# Patient Record
Sex: Female | Born: 1950 | Race: White | Hispanic: No | Marital: Married | Smoking: Former smoker
Health system: Southern US, Community
[De-identification: ages and names within clinical notes are randomized; demographics above are authoritative.]

## PROBLEM LIST (undated history)

## (undated) DIAGNOSIS — M1990 Unspecified osteoarthritis, unspecified site: Secondary | ICD-10-CM

## (undated) DIAGNOSIS — I10 Essential (primary) hypertension: Secondary | ICD-10-CM

## (undated) DIAGNOSIS — R7303 Prediabetes: Secondary | ICD-10-CM

## (undated) DIAGNOSIS — M1712 Unilateral primary osteoarthritis, left knee: Secondary | ICD-10-CM

## (undated) DIAGNOSIS — T7840XA Allergy, unspecified, initial encounter: Secondary | ICD-10-CM

## (undated) HISTORY — DX: Unilateral primary osteoarthritis, left knee: M17.12

## (undated) HISTORY — PX: RHINOPLASTY: SUR1284

## (undated) HISTORY — PX: ABDOMINAL HYSTERECTOMY: SHX81

---

## 2011-01-11 ENCOUNTER — Ambulatory Visit: Payer: Self-pay

## 2013-12-10 DIAGNOSIS — S838X9A Sprain of other specified parts of unspecified knee, initial encounter: Secondary | ICD-10-CM | POA: Diagnosis not present

## 2013-12-10 DIAGNOSIS — S86819A Strain of other muscle(s) and tendon(s) at lower leg level, unspecified leg, initial encounter: Secondary | ICD-10-CM | POA: Diagnosis not present

## 2013-12-10 DIAGNOSIS — R300 Dysuria: Secondary | ICD-10-CM | POA: Diagnosis not present

## 2013-12-24 DIAGNOSIS — S335XXA Sprain of ligaments of lumbar spine, initial encounter: Secondary | ICD-10-CM | POA: Diagnosis not present

## 2013-12-24 DIAGNOSIS — R300 Dysuria: Secondary | ICD-10-CM | POA: Diagnosis not present

## 2014-01-19 DIAGNOSIS — R300 Dysuria: Secondary | ICD-10-CM | POA: Diagnosis not present

## 2014-01-19 DIAGNOSIS — N3000 Acute cystitis without hematuria: Secondary | ICD-10-CM | POA: Diagnosis not present

## 2014-02-09 DIAGNOSIS — R300 Dysuria: Secondary | ICD-10-CM | POA: Diagnosis not present

## 2014-02-09 DIAGNOSIS — H60399 Other infective otitis externa, unspecified ear: Secondary | ICD-10-CM | POA: Diagnosis not present

## 2014-05-04 DIAGNOSIS — R300 Dysuria: Secondary | ICD-10-CM | POA: Diagnosis not present

## 2014-05-04 DIAGNOSIS — N3000 Acute cystitis without hematuria: Secondary | ICD-10-CM | POA: Diagnosis not present

## 2014-06-11 DIAGNOSIS — N3000 Acute cystitis without hematuria: Secondary | ICD-10-CM | POA: Diagnosis not present

## 2014-06-11 DIAGNOSIS — R300 Dysuria: Secondary | ICD-10-CM | POA: Diagnosis not present

## 2014-06-22 DIAGNOSIS — N3000 Acute cystitis without hematuria: Secondary | ICD-10-CM | POA: Diagnosis not present

## 2014-06-22 DIAGNOSIS — M25569 Pain in unspecified knee: Secondary | ICD-10-CM | POA: Diagnosis not present

## 2014-09-27 DIAGNOSIS — N3000 Acute cystitis without hematuria: Secondary | ICD-10-CM | POA: Diagnosis not present

## 2014-12-06 DIAGNOSIS — J0100 Acute maxillary sinusitis, unspecified: Secondary | ICD-10-CM | POA: Diagnosis not present

## 2015-03-01 DIAGNOSIS — N3000 Acute cystitis without hematuria: Secondary | ICD-10-CM | POA: Diagnosis not present

## 2015-03-01 DIAGNOSIS — N3001 Acute cystitis with hematuria: Secondary | ICD-10-CM | POA: Diagnosis not present

## 2015-03-03 ENCOUNTER — Telehealth: Payer: Self-pay

## 2015-03-03 NOTE — Telephone Encounter (Signed)
-----   Message from [name], [place] sent at 03/03/2015 12:22 PM EDT ----- Regarding: urine culture Urine culture with E.Coli sensitive to the antibiotic (nitrofurantoin) you are on

## 2015-03-03 NOTE — Telephone Encounter (Signed)
Left message for patient to call back. KW 

## 2015-03-07 ENCOUNTER — Other Ambulatory Visit: Payer: Self-pay | Admitting: Family Medicine

## 2015-03-08 NOTE — Telephone Encounter (Signed)
Left message to call back  

## 2015-03-08 NOTE — Telephone Encounter (Signed)
Advised patient of lab results  

## 2015-03-08 NOTE — Telephone Encounter (Signed)
Pt returned call. Thanks TNP °

## 2015-03-08 NOTE — Telephone Encounter (Signed)
LMTCB

## 2015-03-17 ENCOUNTER — Other Ambulatory Visit: Payer: Self-pay | Admitting: Family Medicine

## 2015-03-17 DIAGNOSIS — J301 Allergic rhinitis due to pollen: Secondary | ICD-10-CM

## 2015-03-22 DIAGNOSIS — H269 Unspecified cataract: Secondary | ICD-10-CM | POA: Diagnosis not present

## 2015-04-05 ENCOUNTER — Other Ambulatory Visit: Payer: Self-pay

## 2015-04-05 ENCOUNTER — Other Ambulatory Visit: Payer: Self-pay | Admitting: Family Medicine

## 2015-04-14 ENCOUNTER — Other Ambulatory Visit: Payer: Self-pay | Admitting: Family Medicine

## 2015-04-15 ENCOUNTER — Other Ambulatory Visit: Payer: Self-pay | Admitting: Family Medicine

## 2015-04-19 DIAGNOSIS — H2513 Age-related nuclear cataract, bilateral: Secondary | ICD-10-CM | POA: Diagnosis not present

## 2015-04-28 DIAGNOSIS — H2513 Age-related nuclear cataract, bilateral: Secondary | ICD-10-CM | POA: Diagnosis not present

## 2015-05-06 ENCOUNTER — Encounter: Payer: Self-pay | Admitting: *Deleted

## 2015-05-09 NOTE — Discharge Instructions (Signed)

## 2015-05-11 ENCOUNTER — Encounter
Admission: RE | Disposition: A | Discharge status: Home / Self Care | Payer: Self-pay | Source: Ambulatory Visit | Attending: Ophthalmology

## 2015-05-11 ENCOUNTER — Ambulatory Visit: Payer: Medicare Other | Admitting: Anesthesiology

## 2015-05-11 ENCOUNTER — Ambulatory Visit
Admission: RE | Admit: 2015-05-11 | Discharge: 2015-05-11 | Disposition: A | Discharge status: Home / Self Care | Payer: Medicare Other | Source: Ambulatory Visit | Attending: Ophthalmology | Admitting: Ophthalmology

## 2015-05-11 DIAGNOSIS — Z882 Allergy status to sulfonamides status: Secondary | ICD-10-CM | POA: Insufficient documentation

## 2015-05-11 DIAGNOSIS — M1990 Unspecified osteoarthritis, unspecified site: Secondary | ICD-10-CM | POA: Insufficient documentation

## 2015-05-11 DIAGNOSIS — Z87891 Personal history of nicotine dependence: Secondary | ICD-10-CM | POA: Insufficient documentation

## 2015-05-11 DIAGNOSIS — H2511 Age-related nuclear cataract, right eye: Secondary | ICD-10-CM | POA: Diagnosis not present

## 2015-05-11 DIAGNOSIS — H2513 Age-related nuclear cataract, bilateral: Secondary | ICD-10-CM | POA: Diagnosis not present

## 2015-05-11 HISTORY — DX: Unspecified osteoarthritis, unspecified site: M19.90

## 2015-05-11 HISTORY — PX: CATARACT EXTRACTION W/PHACO: SHX586

## 2015-05-11 SURGERY — PHACOEMULSIFICATION, CATARACT, WITH IOL INSERTION
Anesthesia: Monitor Anesthesia Care | Laterality: Right | Wound class: Clean

## 2015-05-11 MED ORDER — LACTATED RINGERS IV SOLN: INTRAVENOUS | Status: DC

## 2015-05-11 MED ORDER — OXYCODONE HCL 5 MG PO TABS: 5.0000 mg | ORAL_TABLET | Freq: Once | ORAL | Status: DC | PRN

## 2015-05-11 MED ORDER — LIDOCAINE HCL (PF) 4 % IJ SOLN
INTRAOCULAR | Status: DC | PRN
  Administered 2015-05-11: 1 mL via OPHTHALMIC

## 2015-05-11 MED ORDER — ARMC OPHTHALMIC DILATING GEL
1.0000 "application " | OPHTHALMIC | Status: DC | PRN
  Administered 2015-05-11 (×2): 1 via OPHTHALMIC

## 2015-05-11 MED ORDER — NA HYALUR & NA CHOND-NA HYALUR 0.4-0.35 ML IO KIT
PACK | INTRAOCULAR | Status: DC | PRN
  Administered 2015-05-11: 1 mL via INTRAOCULAR

## 2015-05-11 MED ORDER — CEFUROXIME OPHTHALMIC INJECTION 1 MG/0.1 ML
INJECTION | OPHTHALMIC | Status: DC | PRN
  Administered 2015-05-11: 0.1 mL via OPHTHALMIC

## 2015-05-11 MED ORDER — EPINEPHRINE HCL 1 MG/ML IJ SOLN
INTRAOCULAR | Status: DC | PRN
  Administered 2015-05-11: 67 mL via OPHTHALMIC

## 2015-05-11 MED ORDER — BRIMONIDINE TARTRATE 0.2 % OP SOLN
OPHTHALMIC | Status: DC | PRN
  Administered 2015-05-11: 1 [drp] via OPHTHALMIC

## 2015-05-11 MED ORDER — TIMOLOL MALEATE 0.5 % OP SOLN
OPHTHALMIC | Status: DC | PRN
  Administered 2015-05-11: 1 [drp] via OPHTHALMIC

## 2015-05-11 MED ORDER — TETRACAINE HCL 0.5 % OP SOLN
1.0000 [drp] | OPHTHALMIC | Status: DC | PRN
  Administered 2015-05-11: 1 [drp] via OPHTHALMIC

## 2015-05-11 MED ORDER — MIDAZOLAM HCL 2 MG/2ML IJ SOLN
INTRAMUSCULAR | Status: DC | PRN
  Administered 2015-05-11: 2 mg via INTRAVENOUS

## 2015-05-11 MED ORDER — FENTANYL CITRATE (PF) 100 MCG/2ML IJ SOLN
INTRAMUSCULAR | Status: DC | PRN
  Administered 2015-05-11: 100 ug via INTRAVENOUS

## 2015-05-11 MED ORDER — OXYCODONE HCL 5 MG/5ML PO SOLN: 5.0000 mg | Freq: Once | ORAL | Status: DC | PRN

## 2015-05-11 MED ORDER — POVIDONE-IODINE 5 % OP SOLN
1.0000 "application " | OPHTHALMIC | Status: DC | PRN
  Administered 2015-05-11: 1 via OPHTHALMIC

## 2015-05-11 SURGICAL SUPPLY — 26 items
CANNULA ANT/CHMB 27GA (MISCELLANEOUS) ×3 IMPLANT
GLOVE SURG LX 7.5 STRW (GLOVE) ×2
GLOVE SURG LX STRL 7.5 STRW (GLOVE) ×1 IMPLANT
GLOVE SURG TRIUMPH 8.0 PF LTX (GLOVE) ×3 IMPLANT
GOWN STRL REUS W/ TWL LRG LVL3 (GOWN DISPOSABLE) ×2 IMPLANT
GOWN STRL REUS W/TWL LRG LVL3 (GOWN DISPOSABLE) ×4
LENS IOL TECNIS 18.5 (Intraocular Lens) ×3 IMPLANT
LENS IOL TECNIS MONO 1P 18.5 (Intraocular Lens) ×1 IMPLANT
MARKER SKIN SURG W/RULER VIO (MISCELLANEOUS) ×3 IMPLANT
NDL RETROBULBAR .5 NSTRL (NEEDLE) IMPLANT
NEEDLE FILTER BLUNT 18X 1/2SAF (NEEDLE) ×2
NEEDLE FILTER BLUNT 18X1 1/2 (NEEDLE) ×1 IMPLANT
PACK CATARACT BRASINGTON (MISCELLANEOUS) ×3 IMPLANT
PACK EYE AFTER SURG (MISCELLANEOUS) ×3 IMPLANT
PACK OPTHALMIC (MISCELLANEOUS) ×3 IMPLANT
RING MALYGIN 7.0 (MISCELLANEOUS) IMPLANT
SUT ETHILON 10-0 CS-B-6CS-B-6 (SUTURE)
SUT VICRYL  9 0 (SUTURE)
SUT VICRYL 9 0 (SUTURE) IMPLANT
SUTURE EHLN 10-0 CS-B-6CS-B-6 (SUTURE) IMPLANT
SYR 3ML LL SCALE MARK (SYRINGE) ×3 IMPLANT
SYR 5ML LL (SYRINGE) IMPLANT
SYR TB 1ML LUER SLIP (SYRINGE) ×3 IMPLANT
WATER STERILE IRR 250ML POUR (IV SOLUTION) ×3 IMPLANT
WATER STERILE IRR 500ML POUR (IV SOLUTION) IMPLANT
WIPE NON LINTING 3.25X3.25 (MISCELLANEOUS) ×3 IMPLANT

## 2015-05-11 NOTE — H&P (Signed)
  The History and Physical notes were scanned in.  The patient remains stable and unchanged from the H&P.   Previous H&P reviewed, patient examined, and there are no changes.  Tammy Hart 05/11/2015 9:45 AM

## 2015-05-11 NOTE — Op Note (Signed)
LOCATION:  [facility]   PREOPERATIVE DIAGNOSIS:    Nuclear sclerotic cataract right eye. H25.11   POSTOPERATIVE DIAGNOSIS:  Nuclear sclerotic cataract right eye.     PROCEDURE:  Phacoemusification with posterior chamber intraocular lens placement of the right eye   LENS:   Implant Name Type Inv. Item Serial No. Manufacturer Lot No. LRB No. Used  [id number] lens     [id number] [employer]   Right 1     18.5 D [id number]   ULTRASOUND TIME: 18.5 % of 1 minutes, 14 seconds.  CDE 13.7   SURGEON:  Deirdre Evener, MD   ANESTHESIA:  Topical with tetracaine drops and 2% Xylocaine jelly.   COMPLICATIONS:  None.   DESCRIPTION OF PROCEDURE:  The patient was identified in the holding room and transported to the operating room and placed in the supine position under the operating microscope.  The right eye was identified as the operative eye and it was prepped and draped in the usual sterile ophthalmic fashion.   A 1 millimeter clear-corneal paracentesis was made at the 12:00 position.  The anterior chamber was filled with Viscoat viscoelastic.  A 2.4 millimeter keratome was used to make a near-clear corneal incision at the 9:00 position.  A curvilinear capsulorrhexis was made with a cystotome and capsulorrhexis forceps.  Balanced salt solution was used to hydrodissect and hydrodelineate the nucleus.   Phacoemulsification was then used in stop and chop fashion to remove the lens nucleus and epinucleus.  The remaining cortex was then removed using the irrigation and aspiration handpiece. Provisc was then placed into the capsular bag to distend it for lens placement.  A lens was then injected into the capsular bag.  The remaining viscoelastic was aspirated.   Wounds were hydrated with balanced salt solution.  The anterior chamber was inflated to a physiologic pressure with balanced salt solution.  No wound leaks were noted. Cefuroxime 0.1 ml of a /ml solution was injected  into the anterior chamber for a dose of 1 mg of intracameral antibiotic at the completion of the case.   Timolol and Brimonidine drops were applied to the eye.  The patient was taken to the recovery room in stable condition without complications of anesthesia or surgery.   Charlyn Vialpando 05/11/2015, 10:38 AM

## 2015-05-11 NOTE — Transfer of Care (Addendum)
Immediate Anesthesia Transfer of Care Note  Patient: Tammy Hart  Procedure(s) Performed: Procedure(s) with comments: CATARACT EXTRACTION PHACO AND INTRAOCULAR LENS PLACEMENT (IOC) (Right) - PER DR OFFICE(ELLEN) PLEASE KEEP PT ARRIVAL TIME 9-9:30  Patient Location: PACU  Anesthesia Type: MAC  Level of Consciousness: awake, alert  and patient cooperative  Airway and Oxygen Therapy: Patient Spontanous Breathing and Patient connected to supplemental oxygen  Post-op Assessment: Post-op Vital signs reviewed, Patient's Cardiovascular Status Stable, Respiratory Function Stable, Patent Airway and No signs of Nausea or vomiting  Post-op Vital Signs: Reviewed and stable  Complications: No apparent anesthesia complications

## 2015-05-11 NOTE — Anesthesia Preprocedure Evaluation (Signed)
Anesthesia Evaluation  Patient identified by MRN, date of birth, ID band Patient awake    Reviewed: Allergy & Precautions, H&P , NPO status   History of Anesthesia Complications Negative for: history of anesthetic complications  Airway Mallampati: II  TM Distance: >3 FB Neck ROM: full    Dental no notable dental hx.    Pulmonary former smoker,    Pulmonary exam normal       Cardiovascular negative cardio ROS Normal cardiovascular exam    Neuro/Psych anxiety   GI/Hepatic negative GI ROS, Neg liver ROS,   Endo/Other  negative endocrine ROS  Renal/GU negative Renal ROS  negative genitourinary   Musculoskeletal   Abdominal   Peds  Hematology negative hematology ROS (+)   Anesthesia Other Findings   Reproductive/Obstetrics                             Anesthesia Physical Anesthesia Plan  ASA: II  Anesthesia Plan: MAC   Post-op Pain Management:    Induction:   Airway Management Planned:   Additional Equipment:   Intra-op Plan:   Post-operative Plan:   Informed Consent: I have reviewed the patients History and Physical, chart, labs and discussed the procedure including the risks, benefits and alternatives for the proposed anesthesia with the patient or authorized representative who has indicated his/her understanding and acceptance.     Plan Discussed with: CRNA  Anesthesia Plan Comments:         Anesthesia Quick Evaluation

## 2015-05-11 NOTE — Anesthesia Procedure Notes (Signed)
Procedure Name: MAC Performed by: Marjan Rosman Pre-anesthesia Checklist: Patient identified, Emergency Drugs available, Suction available, Timeout performed and Patient being monitored Patient Re-evaluated:Patient Re-evaluated prior to inductionOxygen Delivery Method: Nasal cannula Placement Confirmation: positive ETCO2     

## 2015-05-11 NOTE — Anesthesia Postprocedure Evaluation (Signed)
  Anesthesia Post-op Note  Patient: Tammy Hart  Procedure(s) Performed: Procedure(s) with comments: CATARACT EXTRACTION PHACO AND INTRAOCULAR LENS PLACEMENT (IOC) (Right) - PER DR OFFICE(ELLEN) PLEASE KEEP PT ARRIVAL TIME 9-9:30  Anesthesia type:MAC  Patient location: PACU  Post pain: Pain level controlled  Post assessment: Post-op Vital signs reviewed, Patient's Cardiovascular Status Stable, Respiratory Function Stable, Patent Airway and No signs of Nausea or vomiting  Post vital signs: Reviewed and stable  Last Vitals:  Filed Vitals:   05/11/15 1045  BP:   Pulse: 77  Temp: 36.7 C  Resp: 17    Level of consciousness: awake, alert  and patient cooperative  Complications: No apparent anesthesia complications

## 2015-05-12 ENCOUNTER — Encounter: Payer: Self-pay | Admitting: Ophthalmology

## 2015-05-31 ENCOUNTER — Encounter: Payer: Self-pay | Admitting: Ophthalmology

## 2015-05-31 NOTE — Addendum Note (Signed)
Addendum  created 05/31/15 0736 by Andee Poles, CRNA   Modules edited: Anesthesia Events, Narrator, Narrator Events, Notes Section   Narrator:  Narrator: Event Log Edited   Narrator Events:  Delete Handoff event   Notes Section:  File: [id number]

## 2015-06-27 ENCOUNTER — Encounter: Payer: Self-pay | Admitting: Family Medicine

## 2015-06-27 ENCOUNTER — Ambulatory Visit (INDEPENDENT_AMBULATORY_CARE_PROVIDER_SITE_OTHER): Payer: Medicare Other | Admitting: Family Medicine

## 2015-06-27 ENCOUNTER — Other Ambulatory Visit: Payer: Self-pay | Admitting: Family Medicine

## 2015-06-27 VITALS — BP 132/74 | HR 92 | Temp 98.5°F | Resp 16 | Wt 230.6 lb

## 2015-06-27 DIAGNOSIS — N3090 Cystitis, unspecified without hematuria: Secondary | ICD-10-CM

## 2015-06-27 LAB — POCT URINALYSIS DIPSTICK
GLUCOSE: NEGATIVE
NITRITE UA: POSITIVE
Spec Grav, UA: <=1.005
UROBILINOGEN UA: NEGATIVE
pH, UA: 6

## 2015-06-27 MED ORDER — AMOXICILLIN-POT CLAVULANATE 875-125 MG PO TABS
1.0000 | ORAL_TABLET | Freq: Two times a day (BID) | ORAL | Status: DC
Start: 2015-06-27 — End: 2015-06-27

## 2015-06-27 MED ORDER — AMOXICILLIN-POT CLAVULANATE 875-125 MG PO TABS: 1.0000 | ORAL_TABLET | Freq: Two times a day (BID) | ORAL | Status: DC

## 2015-06-27 NOTE — Patient Instructions (Signed)
We will call you with the urine culture result. 

## 2015-06-27 NOTE — Progress Notes (Signed)
Subjective:     Patient ID: Tammy Hart, female   DOB: 12-31-1950, 64 y.o.   MRN: [id number]  HPI  Chief Complaint  Patient presents with  . Dysuria    Patient comes in office today with concerns of burning with urination for the past 24hrs. Patient states she has had lower abdominal opain, and frequency of urination she has taken otc Azo  Last treated in May for an E. Coli UTI with Macrobid.   Review of Systems  Constitutional: Negative for fever and chills.  Gastrointestinal:       No hx of diverticulitis or screening colonscopy       Objective:   Physical Exam  Constitutional: She appears well-developed and well-nourished. No distress.  Abdominal: There is tenderness (mild diffuse in LLQ). There is no guarding.  Genitourinary:  No cva tenderness       Assessment:    1. Cystitis - POCT urinalysis dipstick - Urine culture - amoxicillin-clavulanate (AUGMENTIN) 875-125 MG per tablet; Take 1 tablet by mouth 2 (two) times daily.  Dispense: 14 tablet; Refill 0    Plan:    Further f/u pending urine culture results.

## 2015-06-29 LAB — URINE CULTURE

## 2015-06-30 ENCOUNTER — Telehealth: Payer: Self-pay

## 2015-06-30 NOTE — Telephone Encounter (Signed)
LMTCB-KW 

## 2015-06-30 NOTE — Telephone Encounter (Signed)
-----   Message from Anola Gurney, [place] sent at 06/30/2015  7:42 AM EDT ----- Continue Augmentin for routine E.coli infection.

## 2015-06-30 NOTE — Telephone Encounter (Signed)
Patient has been advised. KW 

## 2015-07-28 ENCOUNTER — Other Ambulatory Visit: Payer: Self-pay | Admitting: Family Medicine

## 2015-08-08 ENCOUNTER — Ambulatory Visit (INDEPENDENT_AMBULATORY_CARE_PROVIDER_SITE_OTHER): Payer: Medicare Other | Admitting: Family Medicine

## 2015-08-08 ENCOUNTER — Encounter: Payer: Self-pay | Admitting: Family Medicine

## 2015-08-08 ENCOUNTER — Other Ambulatory Visit: Payer: Self-pay | Admitting: Family Medicine

## 2015-08-08 VITALS — BP 124/62 | HR 89 | Temp 98.1°F | Resp 16 | Ht 65.5 in | Wt 229.0 lb

## 2015-08-08 DIAGNOSIS — N3001 Acute cystitis with hematuria: Secondary | ICD-10-CM

## 2015-08-08 DIAGNOSIS — Z833 Family history of diabetes mellitus: Secondary | ICD-10-CM | POA: Diagnosis not present

## 2015-08-08 DIAGNOSIS — R739 Hyperglycemia, unspecified: Secondary | ICD-10-CM | POA: Diagnosis not present

## 2015-08-08 DIAGNOSIS — Z87898 Personal history of other specified conditions: Secondary | ICD-10-CM | POA: Insufficient documentation

## 2015-08-08 DIAGNOSIS — J309 Allergic rhinitis, unspecified: Secondary | ICD-10-CM | POA: Insufficient documentation

## 2015-08-08 DIAGNOSIS — J301 Allergic rhinitis due to pollen: Secondary | ICD-10-CM | POA: Diagnosis not present

## 2015-08-08 LAB — POCT URINALYSIS DIPSTICK
GLUCOSE UA: NEGATIVE
Ketones, UA: POSITIVE
NITRITE UA: POSITIVE
PROTEIN UA: 30
Spec Grav, UA: 1.015
UROBILINOGEN UA: 2
pH, UA: 6

## 2015-08-08 LAB — POCT GLYCOSYLATED HEMOGLOBIN (HGB A1C)

## 2015-08-08 MED ORDER — NITROFURANTOIN MONOHYD MACRO 100 MG PO CAPS: 100.0000 mg | ORAL_CAPSULE | Freq: Two times a day (BID) | ORAL | Status: DC

## 2015-08-08 NOTE — Patient Instructions (Signed)
We will call you with the urine culture report. [facility].

## 2015-08-08 NOTE — Progress Notes (Signed)
Subjective:     Patient ID: Tammy Hart, female   DOB: 07/11/1951, 64 y.o.   MRN: [id number]  HPI  Chief Complaint  Patient presents with  . Urinary Tract Infection    Patient comes in office today with complaints of urinary frequency,dysuria,hematuria and groin pain for the past 24 hrs. Patient reports that she has taken otc Azo for relief  Last treated for an E. Coli infection in September. Has had 3  UTI's this year in the absence of sexual activity and no hx of kidney stones. I told her I was concerned about her sugar being elevated. Reports an uncle with diabetes.   Review of Systems  Constitutional: Negative for fever and chills.  Endocrine: Negative for polydipsia, polyphagia and polyuria.  Musculoskeletal:       Reports left knee pain and difficulty flexing has limited her ability to exercise.       Objective:   Physical Exam  Constitutional: She appears well-developed and well-nourished. No distress.       Assessment:    1. Acute cystitis with hematuria - Urine culture - POCT urinalysis dipstick - nitrofurantoin, macrocrystal-monohydrate, (MACROBID) 100 MG capsule; Take 1 capsule (100 mg total) by mouth 2 (two) times daily.  Dispense: 14 capsule; Refill: 0  2. Family history of diabetes mellitus type II  3. Hyperglycemia - POCT glycosylated hemoglobin (Hb A1C)    Plan:    She wishes to amend her lifestyle-provided with information about pre-diabetes classes. Will recheck her sugar in 3 months.

## 2015-08-11 LAB — URINE CULTURE

## 2015-08-12 ENCOUNTER — Telehealth: Payer: Self-pay

## 2015-08-12 NOTE — Telephone Encounter (Signed)
Patient has been notified. KW 

## 2015-08-12 NOTE — Telephone Encounter (Signed)
-----   Message from [name], [place] sent at 08/12/2015 12:05 PM EST ----- Continue nitrofurantoin for an e.Coli UTI. Getting your sugar under control may lessen frequency of infections.

## 2015-08-25 ENCOUNTER — Other Ambulatory Visit: Payer: Self-pay | Admitting: Family Medicine

## 2015-09-26 ENCOUNTER — Other Ambulatory Visit: Payer: Self-pay | Admitting: Family Medicine

## 2015-10-17 ENCOUNTER — Other Ambulatory Visit: Payer: Self-pay | Admitting: Family Medicine

## 2015-10-17 ENCOUNTER — Telehealth: Payer: Self-pay

## 2015-10-17 DIAGNOSIS — G4700 Insomnia, unspecified: Secondary | ICD-10-CM

## 2015-10-17 NOTE — Telephone Encounter (Signed)
-----   Message from [name], [place] sent at 10/17/2015  7:59 AM EST ----- PLEASE CALL IN DIAZEPAM AS UPDATED IN THE EMR

## 2015-10-17 NOTE — Telephone Encounter (Signed)
Prescription was called into pharmacy. KW 

## 2015-10-26 ENCOUNTER — Other Ambulatory Visit: Payer: Self-pay | Admitting: Family Medicine

## 2015-11-09 ENCOUNTER — Ambulatory Visit
Admission: RE | Admit: 2015-11-09 | Discharge: 2015-11-09 | Disposition: A | Discharge status: Home / Self Care | Payer: Medicare Other | Source: Ambulatory Visit | Attending: Family Medicine | Admitting: Family Medicine

## 2015-11-09 ENCOUNTER — Encounter: Payer: Self-pay | Admitting: Family Medicine

## 2015-11-09 ENCOUNTER — Ambulatory Visit (INDEPENDENT_AMBULATORY_CARE_PROVIDER_SITE_OTHER): Payer: Medicare Other | Admitting: Family Medicine

## 2015-11-09 VITALS — BP 124/66 | HR 80 | Temp 98.2°F | Resp 16 | Wt 209.8 lb

## 2015-11-09 DIAGNOSIS — M25562 Pain in left knee: Principal | ICD-10-CM

## 2015-11-09 DIAGNOSIS — R739 Hyperglycemia, unspecified: Secondary | ICD-10-CM | POA: Diagnosis not present

## 2015-11-09 DIAGNOSIS — G8929 Other chronic pain: Secondary | ICD-10-CM

## 2015-11-09 DIAGNOSIS — M1712 Unilateral primary osteoarthritis, left knee: Secondary | ICD-10-CM | POA: Diagnosis not present

## 2015-11-09 DIAGNOSIS — M179 Osteoarthritis of knee, unspecified: Secondary | ICD-10-CM | POA: Diagnosis not present

## 2015-11-09 DIAGNOSIS — M25569 Pain in unspecified knee: Secondary | ICD-10-CM

## 2015-11-09 LAB — POCT GLYCOSYLATED HEMOGLOBIN (HGB A1C): Hemoglobin A1C: 6.5

## 2015-11-09 NOTE — Patient Instructions (Addendum)
We will call with x-ray results. Continue with Lifestyle changes.

## 2015-11-09 NOTE — Progress Notes (Signed)
Subjective:     Patient ID: Tammy Hart, female   DOB: [birth year], 65 y.o.   MRN: [id number]  HPI  Chief Complaint  Patient presents with  . Hyperglycemia    Patient comes in office today for 3 month follow up, last visit was 08/08/15 HgbA1C in office was 7.1%. Patient was advised to work on lifestyle changes and enroll in pre-diabetes class  States she has made dramatic changes in her food choice after attending the Lifestyle class: little bread or sweets and no soda. Her ability to exercise is limited due to chronic left knee pain. She can not flex> 90 degrees without significant pain and has trouble with stairs. Denies significant instablity or swelling.   Review of Systems     Objective:   Physical Exam  Constitutional: She appears well-developed and well-nourished. No distress.  Musculoskeletal:  Left knee without specific areas of tenderness; no effusion or erythema. Ligaments stable. Increased pain with flexion > 90 degrees;       Assessment:    1. Hyperglycemia: Improved!                                        POCT glycosylated hemoglobin (Hb A1C)  2. Chronic knee pain, left - DG Knee Complete 4 Views Left; Future    Plan:    Continue with lifestyle changes/weight loss. Further f/u pending x-ray results.

## 2015-11-10 ENCOUNTER — Telehealth: Payer: Self-pay | Admitting: Family Medicine

## 2015-11-10 ENCOUNTER — Telehealth: Payer: Self-pay

## 2015-11-10 NOTE — Telephone Encounter (Signed)
Pt is requesting referral to Dr Charlann Boxer for left knee pain.Contact at his office is [place].Phone [phone]

## 2015-11-10 NOTE — Telephone Encounter (Signed)
Patient advised as directed below.  Patient does want to proceed with Ortho referral. Patient prefers a appointment at [facility] with Dr. Charlann Boxer. Patient states she will call back to clarify the providers name.

## 2015-11-10 NOTE — Telephone Encounter (Signed)
-----   Message from Anola Gurney, [place] sent at 11/10/2015  7:48 AM EST ----- Significant arthritic changes. Do you wish to see an orthopedic doctor to discuss your options?

## 2015-11-11 ENCOUNTER — Ambulatory Visit: Payer: Self-pay | Admitting: Family Medicine

## 2015-11-11 ENCOUNTER — Other Ambulatory Visit: Payer: Self-pay | Admitting: Family Medicine

## 2015-11-11 DIAGNOSIS — M25562 Pain in left knee: Principal | ICD-10-CM

## 2015-11-11 DIAGNOSIS — G8929 Other chronic pain: Secondary | ICD-10-CM

## 2015-11-29 ENCOUNTER — Ambulatory Visit: Payer: Self-pay | Admitting: Family Medicine

## 2015-11-30 ENCOUNTER — Ambulatory Visit (INDEPENDENT_AMBULATORY_CARE_PROVIDER_SITE_OTHER): Payer: Medicare Other | Admitting: Family Medicine

## 2015-11-30 ENCOUNTER — Encounter: Payer: Self-pay | Admitting: Family Medicine

## 2015-11-30 VITALS — BP 122/74 | HR 74 | Temp 98.0°F | Resp 18 | Wt 206.0 lb

## 2015-11-30 DIAGNOSIS — J301 Allergic rhinitis due to pollen: Secondary | ICD-10-CM

## 2015-11-30 MED ORDER — PREDNISONE 10 MG PO TABS: ORAL_TABLET | ORAL | Status: DC

## 2015-11-30 NOTE — Progress Notes (Signed)
Subjective:     Patient ID: Tammy Hart, female   DOB: Feb 02, 1951, 65 y.o.   MRN: [id number]  HPI  Chief Complaint  Patient presents with  . Cough    Patient comes in office today with concerns of cough and congestion for the past week. Patient reports sinus pain and pressue (facial), trouble breathing and congestion, she states that she has tried otc Flonase, Claritin D and Afrin.   States she feels her allergies have flared with sinus pressure, clear PND and occasional cough. Has been using steroid nasal spray for a week. She has used Afrin nightly on a chronic basis but not when she is using the Flonase.   Review of Systems  Constitutional: Negative for fever and chills.       Objective:   Physical Exam  Constitutional: She appears well-developed and well-nourished. No distress.  Ears: T.M's intact without inflammation Sinuses: mild maxillary sinus tenderness Throat: no tonsillar enlargement or exudate Neck: no cervical adenopathy Lungs: clear     Assessment:    1. Allergic rhinitis due to pollen - predniSONE (DELTASONE) 10 MG tablet; Taper daily as follows: 6 pills, 5, 4, 3, 2, 1  Dispense: 21 tablet; Refill: 0    Plan:    Continue Claritin D and Flonase. Call if not improving. Stop Afrin.

## 2015-11-30 NOTE — Patient Instructions (Signed)
Continue Claritin D and Flonase. Stop Afrin. Let me know how you do on the prednisone.

## 2015-12-07 ENCOUNTER — Encounter: Payer: Self-pay | Admitting: *Deleted

## 2015-12-07 DIAGNOSIS — Z961 Presence of intraocular lens: Secondary | ICD-10-CM | POA: Diagnosis not present

## 2015-12-28 DIAGNOSIS — M25562 Pain in left knee: Secondary | ICD-10-CM | POA: Diagnosis not present

## 2016-01-09 ENCOUNTER — Encounter: Payer: Self-pay | Admitting: Family Medicine

## 2016-01-09 ENCOUNTER — Other Ambulatory Visit: Payer: Self-pay | Admitting: Family Medicine

## 2016-01-09 ENCOUNTER — Ambulatory Visit (INDEPENDENT_AMBULATORY_CARE_PROVIDER_SITE_OTHER): Payer: Medicare Other | Admitting: Family Medicine

## 2016-01-09 VITALS — BP 116/72 | HR 66 | Temp 98.5°F | Resp 16 | Wt 200.0 lb

## 2016-01-09 DIAGNOSIS — N3090 Cystitis, unspecified without hematuria: Secondary | ICD-10-CM | POA: Diagnosis not present

## 2016-01-09 LAB — POCT URINALYSIS DIPSTICK
Glucose, UA: NEGATIVE
KETONES UA: NEGATIVE
NITRITE UA: POSITIVE
PH UA: 6.5
PROTEIN UA: NEGATIVE
Spec Grav, UA: 1.01
UROBILINOGEN UA: 1

## 2016-01-09 MED ORDER — NITROFURANTOIN MONOHYD MACRO 100 MG PO CAPS
100.0000 mg | ORAL_CAPSULE | Freq: Two times a day (BID) | ORAL | Status: DC
Start: 2016-01-09 — End: 2016-01-18

## 2016-01-09 NOTE — Progress Notes (Signed)
Subjective:     Patient ID: Tammy Hart, female   DOB: 07/01/1951, 65 y.o.   MRN: [id number]  HPI  Chief Complaint  Patient presents with  . Urinary Tract Infection    Patient comes in office today with concerns of a urinary infection. Patient complains of dysuria and frequency for the past 4 days.   States she received a steroid injection in her knee prior to the onset of her sx. Last urine culture in the Fall with a sensitive E.Coli. She has continued to lose weight with dramatic changes in her lifestyle. Down 6# since 08/07/16.   Review of Systems  Constitutional: Negative for fever and chills.  Musculoskeletal:       Seeing [facility]([place]) for significant left knee arthritis. Pending physical therapy.       Objective:   Physical Exam  Constitutional: She appears well-developed and well-nourished. No distress.  Genitourinary:  No c.v.a tenderness       Assessment:    1. Cystitis - POCT urinalysis dipstick - Urine culture - nitrofurantoin, macrocrystal-monohydrate, (MACROBID) 100 MG capsule; Take 1 capsule (100 mg total) by mouth 2 (two) times daily.  Dispense: 14 capsule; Refill: 0   Plan:    Further f/u pending urine culture.

## 2016-01-09 NOTE — Patient Instructions (Signed)
We will call you with the culture results 

## 2016-01-11 ENCOUNTER — Telehealth: Payer: Self-pay

## 2016-01-11 DIAGNOSIS — R262 Difficulty in walking, not elsewhere classified: Secondary | ICD-10-CM | POA: Diagnosis not present

## 2016-01-11 DIAGNOSIS — M25562 Pain in left knee: Secondary | ICD-10-CM | POA: Diagnosis not present

## 2016-01-11 DIAGNOSIS — M25662 Stiffness of left knee, not elsewhere classified: Secondary | ICD-10-CM | POA: Diagnosis not present

## 2016-01-11 LAB — URINE CULTURE

## 2016-01-11 NOTE — Telephone Encounter (Signed)
-----   Message from [name], [place] sent at 01/11/2016 11:50 AM EDT ----- Continue nitrofurantion for a routine E. Coli infection.

## 2016-01-11 NOTE — Telephone Encounter (Signed)
Patient advised as directed below. 

## 2016-01-17 DIAGNOSIS — M25662 Stiffness of left knee, not elsewhere classified: Secondary | ICD-10-CM | POA: Diagnosis not present

## 2016-01-17 DIAGNOSIS — M25562 Pain in left knee: Secondary | ICD-10-CM | POA: Diagnosis not present

## 2016-01-17 DIAGNOSIS — R262 Difficulty in walking, not elsewhere classified: Secondary | ICD-10-CM | POA: Diagnosis not present

## 2016-01-18 ENCOUNTER — Encounter: Payer: Self-pay | Admitting: Family Medicine

## 2016-01-18 ENCOUNTER — Ambulatory Visit (INDEPENDENT_AMBULATORY_CARE_PROVIDER_SITE_OTHER): Payer: Medicare Other | Admitting: Family Medicine

## 2016-01-18 ENCOUNTER — Other Ambulatory Visit: Payer: Self-pay | Admitting: Family Medicine

## 2016-01-18 VITALS — BP 102/74 | HR 80 | Temp 98.4°F | Resp 16 | Wt 199.0 lb

## 2016-01-18 DIAGNOSIS — N3091 Cystitis, unspecified with hematuria: Secondary | ICD-10-CM

## 2016-01-18 LAB — POCT URINALYSIS DIPSTICK: GLUCOSE UA: 100

## 2016-01-18 MED ORDER — CIPROFLOXACIN HCL 250 MG PO TABS: 250.0000 mg | ORAL_TABLET | Freq: Two times a day (BID) | ORAL | Status: DC

## 2016-01-18 NOTE — Patient Instructions (Signed)
We will call you with the urine culture results. 

## 2016-01-18 NOTE — Progress Notes (Signed)
Subjective:     Patient ID: Tammy Hart, female   DOB: 03/25/1951, 65 y.o.   MRN: [id number]  HPI  Chief Complaint  Patient presents with  . Dysuria    Patient comes in office today with concerns of burning with urination and blood in urine. Patient was seen in office on 4/14 and treated for UTI, patient states that she completed antibiotic but did not notice any improvement.   Urine culture with a sensitive E.Coli.   Review of Systems  Constitutional: Negative for fever and chills.  Genitourinary: Negative for vaginal discharge.       Objective:   Physical Exam  Constitutional: She appears well-developed and well-nourished. No distress.       Assessment:    1. Cystitis with hematuria - POCT urinalysis dipstick - Urine culture - ciprofloxacin (CIPRO) 250 MG tablet; Take 1 tablet (250 mg total) by mouth 2 (two) times daily.  Dispense: 14 tablet; Refill: 0    Plan:    Further f/u pending culture results.

## 2016-01-19 DIAGNOSIS — M25562 Pain in left knee: Secondary | ICD-10-CM | POA: Diagnosis not present

## 2016-01-19 DIAGNOSIS — M25662 Stiffness of left knee, not elsewhere classified: Secondary | ICD-10-CM | POA: Diagnosis not present

## 2016-01-19 DIAGNOSIS — R262 Difficulty in walking, not elsewhere classified: Secondary | ICD-10-CM | POA: Diagnosis not present

## 2016-01-20 ENCOUNTER — Telehealth: Payer: Self-pay

## 2016-01-20 LAB — URINE CULTURE

## 2016-01-20 NOTE — Telephone Encounter (Signed)
-----   Message from [name], [place] sent at 01/20/2016 12:00 PM EDT ----- Same E. Coli sensitive to everything.

## 2016-01-20 NOTE — Telephone Encounter (Signed)
Patient has been advised. KW 

## 2016-01-24 DIAGNOSIS — M25562 Pain in left knee: Secondary | ICD-10-CM | POA: Diagnosis not present

## 2016-01-24 DIAGNOSIS — M25662 Stiffness of left knee, not elsewhere classified: Secondary | ICD-10-CM | POA: Diagnosis not present

## 2016-01-24 DIAGNOSIS — R262 Difficulty in walking, not elsewhere classified: Secondary | ICD-10-CM | POA: Diagnosis not present

## 2016-01-27 DIAGNOSIS — R262 Difficulty in walking, not elsewhere classified: Secondary | ICD-10-CM | POA: Diagnosis not present

## 2016-01-27 DIAGNOSIS — M25662 Stiffness of left knee, not elsewhere classified: Secondary | ICD-10-CM | POA: Diagnosis not present

## 2016-01-27 DIAGNOSIS — M25562 Pain in left knee: Secondary | ICD-10-CM | POA: Diagnosis not present

## 2016-01-30 DIAGNOSIS — M25662 Stiffness of left knee, not elsewhere classified: Secondary | ICD-10-CM | POA: Diagnosis not present

## 2016-01-30 DIAGNOSIS — R262 Difficulty in walking, not elsewhere classified: Secondary | ICD-10-CM | POA: Diagnosis not present

## 2016-01-30 DIAGNOSIS — M25562 Pain in left knee: Secondary | ICD-10-CM | POA: Diagnosis not present

## 2016-02-03 DIAGNOSIS — M25562 Pain in left knee: Secondary | ICD-10-CM | POA: Diagnosis not present

## 2016-02-03 DIAGNOSIS — R262 Difficulty in walking, not elsewhere classified: Secondary | ICD-10-CM | POA: Diagnosis not present

## 2016-02-03 DIAGNOSIS — M25662 Stiffness of left knee, not elsewhere classified: Secondary | ICD-10-CM | POA: Diagnosis not present

## 2016-02-07 DIAGNOSIS — M25662 Stiffness of left knee, not elsewhere classified: Secondary | ICD-10-CM | POA: Diagnosis not present

## 2016-02-07 DIAGNOSIS — R262 Difficulty in walking, not elsewhere classified: Secondary | ICD-10-CM | POA: Diagnosis not present

## 2016-02-07 DIAGNOSIS — M25562 Pain in left knee: Secondary | ICD-10-CM | POA: Diagnosis not present

## 2016-02-08 ENCOUNTER — Telehealth: Payer: Self-pay | Admitting: Family Medicine

## 2016-02-08 ENCOUNTER — Ambulatory Visit (INDEPENDENT_AMBULATORY_CARE_PROVIDER_SITE_OTHER): Payer: Medicare Other | Admitting: Family Medicine

## 2016-02-08 VITALS — BP 116/64 | HR 68 | Temp 97.7°F | Resp 16 | Wt 198.0 lb

## 2016-02-08 DIAGNOSIS — Z833 Family history of diabetes mellitus: Secondary | ICD-10-CM

## 2016-02-08 DIAGNOSIS — R739 Hyperglycemia, unspecified: Secondary | ICD-10-CM | POA: Diagnosis not present

## 2016-02-08 LAB — POCT GLYCOSYLATED HEMOGLOBIN (HGB A1C): Hemoglobin A1C: 6

## 2016-02-08 NOTE — Progress Notes (Signed)
Subjective:     Patient ID: Tammy Hart, female   DOB: 06/17/1951, 65 y.o.   MRN: [id number]  HPI  Chief Complaint  Patient presents with  . Hyperglycemia  Continues to maintain low carbohydrate food choices and has lost an additional pound since last o.v. Exercise is limited by severe left knee osteoarthritis for which she is receiving physical therapy. She is going to discuss TKR with her orthopedic surgeon at her next visit in [place].    Review of Systems     Objective:   Physical Exam  Constitutional: She appears well-developed and well-nourished. No distress.       Assessment:    1. Family history of diabetes mellitus type II  2. Hyperglycemia: resolving with lifestyle changes - POCT glycosylated hemoglobin (Hb A1C)    Plan:    Continue current dietary choices and f/u with orthopedics regarding possible surgery. Discussed likelihood her weight would plateau until she could actively burn calories.

## 2016-02-08 NOTE — Patient Instructions (Signed)
Do f/u with orthopedics regarding possible knee surgery.

## 2016-02-10 DIAGNOSIS — M25662 Stiffness of left knee, not elsewhere classified: Secondary | ICD-10-CM | POA: Diagnosis not present

## 2016-02-10 DIAGNOSIS — M25562 Pain in left knee: Secondary | ICD-10-CM | POA: Diagnosis not present

## 2016-02-10 DIAGNOSIS — R262 Difficulty in walking, not elsewhere classified: Secondary | ICD-10-CM | POA: Diagnosis not present

## 2016-02-14 DIAGNOSIS — R262 Difficulty in walking, not elsewhere classified: Secondary | ICD-10-CM | POA: Diagnosis not present

## 2016-02-14 DIAGNOSIS — M25562 Pain in left knee: Secondary | ICD-10-CM | POA: Diagnosis not present

## 2016-02-14 DIAGNOSIS — M25662 Stiffness of left knee, not elsewhere classified: Secondary | ICD-10-CM | POA: Diagnosis not present

## 2016-02-16 DIAGNOSIS — R262 Difficulty in walking, not elsewhere classified: Secondary | ICD-10-CM | POA: Diagnosis not present

## 2016-02-16 DIAGNOSIS — M25562 Pain in left knee: Secondary | ICD-10-CM | POA: Diagnosis not present

## 2016-02-16 DIAGNOSIS — M25662 Stiffness of left knee, not elsewhere classified: Secondary | ICD-10-CM | POA: Diagnosis not present

## 2016-03-04 ENCOUNTER — Other Ambulatory Visit: Payer: Self-pay | Admitting: Family Medicine

## 2016-03-28 DIAGNOSIS — M1712 Unilateral primary osteoarthritis, left knee: Secondary | ICD-10-CM | POA: Diagnosis not present

## 2016-04-24 ENCOUNTER — Other Ambulatory Visit: Payer: Self-pay | Admitting: Family Medicine

## 2016-04-24 DIAGNOSIS — G4700 Insomnia, unspecified: Secondary | ICD-10-CM

## 2016-05-07 DIAGNOSIS — M1712 Unilateral primary osteoarthritis, left knee: Secondary | ICD-10-CM | POA: Diagnosis not present

## 2016-05-09 ENCOUNTER — Encounter: Payer: Self-pay | Admitting: Family Medicine

## 2016-05-09 ENCOUNTER — Ambulatory Visit (INDEPENDENT_AMBULATORY_CARE_PROVIDER_SITE_OTHER): Payer: Medicare Other | Admitting: Family Medicine

## 2016-05-09 VITALS — BP 110/70 | HR 68 | Temp 98.1°F | Resp 16 | Wt 194.0 lb

## 2016-05-09 DIAGNOSIS — Z01818 Encounter for other preprocedural examination: Secondary | ICD-10-CM

## 2016-05-09 DIAGNOSIS — G4700 Insomnia, unspecified: Secondary | ICD-10-CM

## 2016-05-09 DIAGNOSIS — R739 Hyperglycemia, unspecified: Secondary | ICD-10-CM | POA: Diagnosis not present

## 2016-05-09 LAB — EKG 12-LEAD

## 2016-05-09 LAB — POCT GLYCOSYLATED HEMOGLOBIN (HGB A1C)

## 2016-05-09 MED ORDER — DIAZEPAM 5 MG PO TABS: ORAL_TABLET | ORAL | 5 refills | Status: DC

## 2016-05-09 NOTE — Progress Notes (Addendum)
Subjective:     Patient ID: Tammy Hart, female   DOB: 05/23/1951, 65 y.o.   MRN: [id number]  HPI  Chief Complaint  Patient presents with  . Hyperglycemia    Patient returns to office today for 3 month follow up, patient was last seen 02/08/16. At last visit HgbA1c was at 6%, patient condition was stable due to lifestyle changes.  She continues to be scrupulous about her diet and has lost 4 # since last o.v. Exercise has been limited by knee DJD and she is pending left total knee arthroplasty next month. Has form today for pre-op clearance.   Review of Systems  Respiratory: Negative for shortness of breath.   Cardiovascular: Negative for chest pain and palpitations.       Objective:   Physical Exam  Constitutional: She appears well-developed and well-nourished. No distress.  Cardiovascular: Normal rate and regular rhythm.   Pulmonary/Chest: Breath sounds normal.  Musculoskeletal: She exhibits no edema (of lower extremities).       Assessment:    1. Hyperglycemia - POCT glycosylated hemoglobin (Hb A1C)  2. Preoperative examination - EKG 12-Lead  3. Insomnia - diazepam (VALIUM) 5 MG tablet; TAKE 1/2 TO 1 TABLET BY MOUTH EVERY NIGHT AT BEDTIME AS NEEDED  Dispense: 30 tablet; Refill: 5    Plan:    Pre-op form completed and will be faxed today.

## 2016-05-09 NOTE — Patient Instructions (Signed)
Good luck with surgery! We will send in your pre-operative form today.

## 2016-05-10 ENCOUNTER — Ambulatory Visit: Payer: Self-pay | Admitting: Family Medicine

## 2016-05-30 NOTE — H&P (Signed)
TOTAL KNEE ADMISSION H&P  Patient is being admitted for left total knee arthroplasty.  Subjective:  Chief Complaint:   Left knee primary OA / pain  HPI: Tammy Hart, 65 y.o. female, has a history of pain and functional disability in the left knee due to arthritis and has failed non-surgical conservative treatments for greater than 12 weeks to includeNSAID's and/or analgesics, corticosteriod injections and activity modification.  Onset of symptoms was gradual, starting 5-6 years ago with gradually worsening course since that time. The patient noted no past surgery on the left knee(s).  Patient currently rates pain in the left knee(s) at 10 out of 10 with activity. Patient has worsening of pain with activity and weight bearing, pain that interferes with activities of daily living, pain with passive range of motion, crepitus and joint swelling.  Patient has evidence of periarticular osteophytes and joint space narrowing by imaging studies.  There is no active infection.   Risks, benefits and expectations were discussed with the patient.  Risks including but not limited to the risk of anesthesia, blood clots, nerve damage, blood vessel damage, failure of the prosthesis, infection and up to and including death.  Patient understand the risks, benefits and expectations and wishes to proceed with surgery.   PCP: Anola Gurney, PA  D/C Plans:      Home  Post-op Meds:       No Rx given   Tranexamic Acid:      To be given - IV  Decadron:      Is to be given  FYI:     ASA  Norco    Patient Active Problem List   Diagnosis Date Noted  . Hyperglycemia 11/09/2015  . Chronic knee pain 11/09/2015  . H/O headache 08/08/2015  . Allergic rhinitis 08/08/2015   Past Medical History:  Diagnosis Date  . Arthritis    neck (mainly), other joints mildly  . Degenerative joint disease of left knee     Past Surgical History:  Procedure Laterality Date  . ABDOMINAL HYSTERECTOMY    . CATARACT EXTRACTION  W/PHACO Right 05/11/2015   Procedure: CATARACT EXTRACTION PHACO AND INTRAOCULAR LENS PLACEMENT (IOC);  Surgeon: Lockie Mola, MD;  Location: [facility];  Service: Ophthalmology;  Laterality: Right;  PER DR OFFICE(ELLEN) PLEASE KEEP PT ARRIVAL TIME 9-9:30  . RHINOPLASTY      No prescriptions prior to admission.   Allergies  Allergen Reactions  . Nitrofurantoin Monohyd Macro Nausea And Vomiting  . Sulfa Antibiotics Rash    Swelling, itching    Social History  Substance Use Topics  . Smoking status: Former Smoker    Quit date: 10/01/2006  . Smokeless tobacco: Not on file  . Alcohol use No    Family History  Problem Relation Age of Onset  . Stroke Mother   . Congestive Heart Failure Father   . Hypertension Son   . Stroke Maternal Grandfather   . Congestive Heart Failure Paternal Grandmother   . Congestive Heart Failure Paternal Grandfather      Review of Systems  Constitutional: Negative.   Eyes: Negative.   Respiratory: Negative.   Cardiovascular: Negative.   Gastrointestinal: Negative.   Genitourinary: Negative.   Musculoskeletal: Positive for joint pain.  Skin: Negative.   Neurological: Positive for headaches.  Endo/Heme/Allergies: Positive for environmental allergies.  Psychiatric/Behavioral: Negative.     Objective:  Physical Exam  Constitutional: She is oriented to person, place, and time. She appears well-developed.  HENT:  Head: Normocephalic.  Eyes:  Pupils are equal, round, and reactive to light.  Neck: Neck supple. No JVD present. No tracheal deviation present. No thyromegaly present.  Cardiovascular: Normal rate, regular rhythm, normal heart sounds and intact distal pulses.   Respiratory: Effort normal and breath sounds normal. No respiratory distress. She has no wheezes.  GI: Soft. There is no tenderness. There is no guarding.  Musculoskeletal:       Left knee: She exhibits decreased range of motion, swelling and bony tenderness. She  exhibits no ecchymosis, no deformity, no laceration, no erythema and normal alignment. Tenderness found.  Lymphadenopathy:    She has no cervical adenopathy.  Neurological: She is alert and oriented to person, place, and time. A sensory deficit (Bilateral LE numbness (L>R)) is present.  Skin: Skin is warm and dry.  Psychiatric: She has a normal mood and affect.      Labs:  Estimated body mass index is 31.79 kg/m as calculated from the following:   Height as of 08/08/15: 5' 5.5" (1.664 m).   Weight as of 05/09/16: 88 kg (194 lb).   Imaging Review Plain radiographs demonstrate severe degenerative joint disease of the left knee(s). The overall alignment is neutral. The bone quality appears to be good for age and reported activity level.  Assessment/Plan:  End stage arthritis, left knee   The patient history, physical examination, clinical judgment of the provider and imaging studies are consistent with end stage degenerative joint disease of the left knee(s) and total knee arthroplasty is deemed medically necessary. The treatment options including medical management, injection therapy arthroscopy and arthroplasty were discussed at length. The risks and benefits of total knee arthroplasty were presented and reviewed. The risks due to aseptic loosening, infection, stiffness, patella tracking problems, thromboembolic complications and other imponderables were discussed. The patient acknowledged the explanation, agreed to proceed with the plan and consent was signed. Patient is being admitted for inpatient treatment for surgery, pain control, PT, OT, prophylactic antibiotics, VTE prophylaxis, progressive ambulation and ADL's and discharge planning. The patient is planning to be discharged home.      Anastasio AuerbachMatthew S. Taimane Stimmel   PA-C  05/30/2016, 1:29 PM

## 2016-06-03 ENCOUNTER — Other Ambulatory Visit: Payer: Self-pay | Admitting: Family Medicine

## 2016-06-08 ENCOUNTER — Encounter (HOSPITAL_COMMUNITY)
Admission: RE | Admit: 2016-06-08 | Discharge: 2016-06-08 | Disposition: A | Discharge status: Home / Self Care | Payer: Medicare Other | Source: Ambulatory Visit | Attending: Orthopedic Surgery | Admitting: Orthopedic Surgery

## 2016-06-08 ENCOUNTER — Encounter (HOSPITAL_COMMUNITY): Payer: Self-pay | Admitting: *Deleted

## 2016-06-08 DIAGNOSIS — M1712 Unilateral primary osteoarthritis, left knee: Secondary | ICD-10-CM | POA: Diagnosis not present

## 2016-06-08 DIAGNOSIS — M25562 Pain in left knee: Secondary | ICD-10-CM | POA: Insufficient documentation

## 2016-06-08 DIAGNOSIS — Z01812 Encounter for preprocedural laboratory examination: Secondary | ICD-10-CM | POA: Insufficient documentation

## 2016-06-08 HISTORY — DX: Allergy, unspecified, initial encounter: T78.40XA

## 2016-06-08 HISTORY — DX: Prediabetes: R73.03

## 2016-06-08 LAB — BASIC METABOLIC PANEL
ANION GAP: 7 (ref 5–15)
BUN: 20 mg/dL (ref 6–20)
CALCIUM: 9.4 mg/dL (ref 8.9–10.3)
CO2: 28 mmol/L (ref 22–32)
CREATININE: 0.85 mg/dL (ref 0.44–1.00)
Chloride: 105 mmol/L (ref 101–111)
Glucose, Bld: 88 mg/dL (ref 65–99)
Potassium: 4.5 mmol/L (ref 3.5–5.1)
Sodium: 140 mmol/L (ref 135–145)

## 2016-06-08 LAB — CBC
HCT: 40.1 % (ref 36.0–46.0)
Hemoglobin: 13.4 g/dL (ref 12.0–15.0)
MCH: 30.2 pg (ref 26.0–34.0)
MCHC: 33.4 g/dL (ref 30.0–36.0)
MCV: 90.5 fL (ref 78.0–100.0)
PLATELETS: 301 10*3/uL (ref 150–400)
RBC: 4.43 MIL/uL (ref 3.87–5.11)
RDW: 13.9 % (ref 11.5–15.5)
WBC: 9.6 10*3/uL (ref 4.0–10.5)

## 2016-06-08 LAB — SURGICAL PCR SCREEN
MRSA, PCR: NEGATIVE
STAPHYLOCOCCUS AUREUS: NEGATIVE

## 2016-06-08 LAB — ABO/RH: ABO/RH(D): O POS

## 2016-06-08 NOTE — Patient Instructions (Addendum)
Tammy Hart  06/08/2016   Your procedure is scheduled on: 06/12/16  [facility]  [place] to 3rd floor to  Short Stay Center at [phone] AM  Call this number if you have problems the morning of surgery [phone]   Remember: ONLY 1 PERSON MAY GO WITH YOU TO SHORT STAY TO GET  READY MORNING OF YOUR SURGERY.  Do not eat food:After Midnight. MAY HAVE CLEAR LIQUIDS UNTIL [id number] THE MORNING OF SURGERY -- THEN NOTHING     Take these medicines the morning of surgery with A SIP OF WATER:  Tramadol, Claritin- MAY USE FLONASE IF NEEDED                                You may not have any metal on your body including hair pins and              piercings  Do not wear jewelry, make-up, lotions, powders or perfumes, deodorant             Do not wear nail polish.  Do not shave  48 hours prior to surgery.              Men may shave face and neck.   Do not bring valuables to the hospital. [place] IS NOT             RESPONSIBLE   FOR VALUABLES.  Contacts, dentures or bridgework may not be worn into surgery.  Leave suitcase in the car. After surgery it may be brought to your room.            Tammy Hart - Preparing for Surgery Before surgery, you can play an important role.  Because skin is not sterile, your skin needs to be as free of germs as possible.  You can reduce the number of germs on your skin by washing with CHG (chlorahexidine gluconate) soap before surgery.  CHG is an antiseptic cleaner which kills germs and bonds with the skin to continue killing germs even after washing. Please DO NOT use if you have an allergy to CHG or antibacterial soaps.  If your skin becomes reddened/irritated stop using the CHG and inform your nurse when you arrive at Short Stay. Do not shave (including legs and underarms) for at least 48 hours prior to the first CHG shower.  You may shave your face/neck. Please follow these instructions carefully:  1.   Shower with CHG Soap the night before surgery and the  morning of Surgery.  2.  If you choose to wash your hair, wash your hair first as usual with your  normal  shampoo.  3.  After you shampoo, rinse your hair and body thoroughly to remove the  shampoo.                           4.  Use CHG as you would any other liquid soap.  You can apply chg directly  to the skin and wash                       Gently with a scrungie or clean washcloth.  5.  Apply the CHG Soap to your body ONLY FROM THE NECK DOWN.   Do not use  on face/ open                           Wound or open sores. Avoid contact with eyes, ears mouth and genitals (private parts).                       Wash face,  Genitals (private parts) with your normal soap.             6.  Wash thoroughly, paying special attention to the area where your surgery  will be performed.  7.  Thoroughly rinse your body with warm water from the neck down.  8.  DO NOT shower/wash with your normal soap after using and rinsing off  the CHG Soap.                9.  Pat yourself dry with a clean towel.            10.  Wear clean pajamas.            11.  Place clean sheets on your bed the night of your first shower and do not  sleep with pets. Day of Surgery : Do not apply any lotions/deodorants the morning of surgery.  Please wear clean clothes to the hospital/surgery center.  FAILURE TO FOLLOW THESE INSTRUCTIONS MAY RESULT IN THE CANCELLATION OF YOUR SURGERY PATIENT SIGNATURE_________________________________  NURSE SIGNATURE__________________________________  ________________________________________________________________________  WHAT IS A BLOOD TRANSFUSION? Blood Transfusion Information  A transfusion is the replacement of blood or some of its parts. Blood is made up of multiple cells which provide different functions.  Red blood cells carry oxygen and are used for blood loss replacement.  White blood cells fight against infection.  Platelets control  bleeding.  Plasma helps clot blood.  Other blood products are available for specialized needs, such as hemophilia or other clotting disorders. BEFORE THE TRANSFUSION  Who gives blood for transfusions?   Healthy volunteers who are fully evaluated to make sure their blood is safe. This is blood bank blood. Transfusion therapy is the safest it has ever been in the practice of medicine. Before blood is taken from a donor, a complete history is taken to make sure that person has no history of diseases nor engages in risky social behavior (examples are intravenous drug use or sexual activity with multiple partners). The donor's travel history is screened to minimize risk of transmitting infections, such as malaria. The donated blood is tested for signs of infectious diseases, such as HIV and hepatitis. The blood is then tested to be sure it is compatible with you in order to minimize the chance of a transfusion reaction. If you or a relative donates blood, this is often done in anticipation of surgery and is not appropriate for emergency situations. It takes many days to process the donated blood. RISKS AND COMPLICATIONS Although transfusion therapy is very safe and saves many lives, the main dangers of transfusion include:   Getting an infectious disease.  Developing a transfusion reaction. This is an allergic reaction to something in the blood you were given. Every precaution is taken to prevent this. The decision to have a blood transfusion has been considered carefully by your caregiver before blood is given. Blood is not given unless the benefits outweigh the risks. AFTER THE TRANSFUSION  Right after receiving a blood transfusion, you will usually feel much better and more energetic. This is especially true if your  red blood cells have gotten low (anemic). The transfusion raises the level of the red blood cells which carry oxygen, and this usually causes an energy increase.  The nurse  administering the transfusion will monitor you carefully for complications. HOME CARE INSTRUCTIONS  No special instructions are needed after a transfusion. You may find your energy is better. Speak with your caregiver about any limitations on activity for underlying diseases you may have. SEEK MEDICAL CARE IF:   Your condition is not improving after your transfusion.  You develop redness or irritation at the intravenous (IV) site. SEEK IMMEDIATE MEDICAL CARE IF:  Any of the following symptoms occur over the next 12 hours:  Shaking chills.  You have a temperature by mouth above 102 F (38.9 C), not controlled by medicine.  Chest, back, or muscle pain.  People around you feel you are not acting correctly or are confused.  Shortness of breath or difficulty breathing.  Dizziness and fainting.  You get a rash or develop hives.  You have a decrease in urine output.  Your urine turns a dark color or changes to pink, red, or brown. Any of the following symptoms occur over the next 10 days:  You have a temperature by mouth above 102 F (38.9 C), not controlled by medicine.  Shortness of breath.  Weakness after normal activity.  The white part of the eye turns yellow (jaundice).  You have a decrease in the amount of urine or are urinating less often.  Your urine turns a dark color or changes to pink, red, or brown. Document Released: 09/14/2000 Document Revised: 12/10/2011 Document Reviewed: 05/03/2008 [facility] Patient Information 2014 [place], [place].  _______________________________________________________________________

## 2016-06-12 ENCOUNTER — Inpatient Hospital Stay (HOSPITAL_COMMUNITY)
Admission: RE | Admit: 2016-06-12 | Discharge: 2016-06-13 | DRG: 470 | Disposition: A | Discharge status: Home / Self Care | Payer: Medicare Other | Source: Ambulatory Visit | Attending: Orthopedic Surgery | Admitting: Orthopedic Surgery

## 2016-06-12 ENCOUNTER — Encounter (HOSPITAL_COMMUNITY)
Admission: RE | Disposition: A | Discharge status: Home / Self Care | Payer: Self-pay | Source: Ambulatory Visit | Attending: Orthopedic Surgery

## 2016-06-12 ENCOUNTER — Inpatient Hospital Stay (HOSPITAL_COMMUNITY): Payer: Medicare Other | Admitting: Anesthesiology

## 2016-06-12 ENCOUNTER — Encounter (HOSPITAL_COMMUNITY): Payer: Self-pay | Admitting: *Deleted

## 2016-06-12 DIAGNOSIS — Z823 Family history of stroke: Secondary | ICD-10-CM

## 2016-06-12 DIAGNOSIS — Z87891 Personal history of nicotine dependence: Secondary | ICD-10-CM | POA: Diagnosis not present

## 2016-06-12 DIAGNOSIS — E669 Obesity, unspecified: Secondary | ICD-10-CM | POA: Diagnosis present

## 2016-06-12 DIAGNOSIS — Z6831 Body mass index (BMI) 31.0-31.9, adult: Secondary | ICD-10-CM | POA: Diagnosis not present

## 2016-06-12 DIAGNOSIS — Z8249 Family history of ischemic heart disease and other diseases of the circulatory system: Secondary | ICD-10-CM

## 2016-06-12 DIAGNOSIS — M659 Synovitis and tenosynovitis, unspecified: Secondary | ICD-10-CM | POA: Diagnosis present

## 2016-06-12 DIAGNOSIS — Z96659 Presence of unspecified artificial knee joint: Secondary | ICD-10-CM

## 2016-06-12 DIAGNOSIS — Z96652 Presence of left artificial knee joint: Secondary | ICD-10-CM

## 2016-06-12 DIAGNOSIS — M25562 Pain in left knee: Secondary | ICD-10-CM | POA: Diagnosis not present

## 2016-06-12 DIAGNOSIS — M1712 Unilateral primary osteoarthritis, left knee: Secondary | ICD-10-CM | POA: Diagnosis not present

## 2016-06-12 DIAGNOSIS — M179 Osteoarthritis of knee, unspecified: Secondary | ICD-10-CM | POA: Diagnosis not present

## 2016-06-12 HISTORY — PX: TOTAL KNEE ARTHROPLASTY: SHX125

## 2016-06-12 LAB — TYPE AND SCREEN
ABO/RH(D): O POS
ANTIBODY SCREEN: NEGATIVE

## 2016-06-12 LAB — GLUCOSE, CAPILLARY: Glucose-Capillary: 97 mg/dL (ref 65–99)

## 2016-06-12 SURGERY — ARTHROPLASTY, KNEE, TOTAL
Anesthesia: Spinal | Site: Knee | Laterality: Left

## 2016-06-12 MED ORDER — SODIUM CHLORIDE 0.9 % IJ SOLN
INTRAMUSCULAR | Status: AC
  Filled 2016-06-12: qty 50

## 2016-06-12 MED ORDER — SODIUM CHLORIDE 0.9 % IR SOLN
Status: DC | PRN
  Administered 2016-06-12: 1000 mL

## 2016-06-12 MED ORDER — DOCUSATE SODIUM 100 MG PO CAPS
100.0000 mg | ORAL_CAPSULE | Freq: Two times a day (BID) | ORAL | Status: DC
  Administered 2016-06-12 – 2016-06-13 (×2): 100 mg via ORAL
  Filled 2016-06-12 (×2): qty 1

## 2016-06-12 MED ORDER — DEXAMETHASONE SODIUM PHOSPHATE 10 MG/ML IJ SOLN
INTRAMUSCULAR | Status: AC
  Filled 2016-06-12: qty 1

## 2016-06-12 MED ORDER — 0.9 % SODIUM CHLORIDE (POUR BTL) OPTIME
TOPICAL | Status: DC | PRN
  Administered 2016-06-12: 1000 mL

## 2016-06-12 MED ORDER — SODIUM CHLORIDE 0.9 % IJ SOLN
INTRAMUSCULAR | Status: DC | PRN
  Administered 2016-06-12: 30 mL

## 2016-06-12 MED ORDER — PROMETHAZINE HCL 25 MG/ML IJ SOLN: 6.2500 mg | INTRAMUSCULAR | Status: DC | PRN

## 2016-06-12 MED ORDER — SODIUM CHLORIDE 0.9 % IV SOLN
INTRAVENOUS | Status: DC
  Administered 2016-06-12 – 2016-06-13 (×2): via INTRAVENOUS

## 2016-06-12 MED ORDER — PROPOFOL 10 MG/ML IV BOLUS
INTRAVENOUS | Status: AC
  Filled 2016-06-12: qty 20

## 2016-06-12 MED ORDER — DEXAMETHASONE SODIUM PHOSPHATE 10 MG/ML IJ SOLN: 10.0000 mg | Freq: Once | INTRAMUSCULAR | Status: DC

## 2016-06-12 MED ORDER — CEFAZOLIN SODIUM-DEXTROSE 2-4 GM/100ML-% IV SOLN
INTRAVENOUS | Status: AC
  Filled 2016-06-12: qty 100

## 2016-06-12 MED ORDER — FERROUS SULFATE 325 (65 FE) MG PO TABS
325.0000 mg | ORAL_TABLET | Freq: Three times a day (TID) | ORAL | Status: DC
  Administered 2016-06-12 – 2016-06-13 (×2): 325 mg via ORAL
  Filled 2016-06-12 (×2): qty 1

## 2016-06-12 MED ORDER — POLYETHYLENE GLYCOL 3350 17 G PO PACK: 17.0000 g | PACK | Freq: Two times a day (BID) | ORAL | 0 refills | Status: DC

## 2016-06-12 MED ORDER — ONDANSETRON HCL 4 MG PO TABS: 4.0000 mg | ORAL_TABLET | Freq: Four times a day (QID) | ORAL | Status: DC | PRN

## 2016-06-12 MED ORDER — METHOCARBAMOL 1000 MG/10ML IJ SOLN
500.0000 mg | Freq: Four times a day (QID) | INTRAVENOUS | Status: DC | PRN
  Administered 2016-06-12: 500 mg via INTRAVENOUS
  Filled 2016-06-12: qty 5
  Filled 2016-06-12: qty 550

## 2016-06-12 MED ORDER — KETOROLAC TROMETHAMINE 30 MG/ML IJ SOLN
INTRAMUSCULAR | Status: DC | PRN
  Administered 2016-06-12: 30 mg

## 2016-06-12 MED ORDER — KETOROLAC TROMETHAMINE 30 MG/ML IJ SOLN
INTRAMUSCULAR | Status: AC
  Filled 2016-06-12: qty 1

## 2016-06-12 MED ORDER — BUPIVACAINE IN DEXTROSE 0.75-8.25 % IT SOLN
INTRATHECAL | Status: DC | PRN
  Administered 2016-06-12: 2 mL via INTRATHECAL

## 2016-06-12 MED ORDER — PHENYLEPHRINE HCL 10 MG/ML IJ SOLN
INTRAMUSCULAR | Status: AC
  Filled 2016-06-12: qty 1

## 2016-06-12 MED ORDER — PHENYLEPHRINE HCL 10 MG/ML IJ SOLN
INTRAVENOUS | Status: DC | PRN
  Administered 2016-06-12: 10 ug/min via INTRAVENOUS

## 2016-06-12 MED ORDER — BUPIVACAINE HCL 0.25 % IJ SOLN
INTRAMUSCULAR | Status: DC | PRN
  Administered 2016-06-12: 30 mL

## 2016-06-12 MED ORDER — ONDANSETRON HCL 4 MG/2ML IJ SOLN
INTRAMUSCULAR | Status: AC
  Filled 2016-06-12: qty 2

## 2016-06-12 MED ORDER — ONDANSETRON HCL 4 MG/2ML IJ SOLN: 4.0000 mg | Freq: Four times a day (QID) | INTRAMUSCULAR | Status: DC | PRN

## 2016-06-12 MED ORDER — FENTANYL CITRATE (PF) 100 MCG/2ML IJ SOLN: 25.0000 ug | INTRAMUSCULAR | Status: DC | PRN

## 2016-06-12 MED ORDER — DIAZEPAM 5 MG PO TABS: 2.5000 mg | ORAL_TABLET | Freq: Every evening | ORAL | Status: DC | PRN

## 2016-06-12 MED ORDER — LACTATED RINGERS IV SOLN
INTRAVENOUS | Status: DC
  Administered 2016-06-12 (×2): via INTRAVENOUS

## 2016-06-12 MED ORDER — ASPIRIN 81 MG PO CHEW: 81.0000 mg | CHEWABLE_TABLET | Freq: Two times a day (BID) | ORAL | 0 refills | Status: AC

## 2016-06-12 MED ORDER — FLUTICASONE PROPIONATE 50 MCG/ACT NA SUSP
1.0000 | Freq: Every day | NASAL | Status: DC | PRN
  Filled 2016-06-12: qty 16

## 2016-06-12 MED ORDER — TRANEXAMIC ACID 1000 MG/10ML IV SOLN
1000.0000 mg | INTRAVENOUS | Status: AC
  Administered 2016-06-12: 1000 mg via INTRAVENOUS
  Filled 2016-06-12: qty 1100

## 2016-06-12 MED ORDER — POLYETHYLENE GLYCOL 3350 17 G PO PACK
17.0000 g | PACK | Freq: Two times a day (BID) | ORAL | Status: DC
  Administered 2016-06-12 – 2016-06-13 (×2): 17 g via ORAL
  Filled 2016-06-12 (×2): qty 1

## 2016-06-12 MED ORDER — METOCLOPRAMIDE HCL 5 MG PO TABS: 5.0000 mg | ORAL_TABLET | Freq: Three times a day (TID) | ORAL | Status: DC | PRN

## 2016-06-12 MED ORDER — FERROUS SULFATE 325 (65 FE) MG PO TABS: 325.0000 mg | ORAL_TABLET | Freq: Three times a day (TID) | ORAL | 3 refills | Status: DC

## 2016-06-12 MED ORDER — ASPIRIN 81 MG PO CHEW
81.0000 mg | CHEWABLE_TABLET | Freq: Two times a day (BID) | ORAL | Status: DC
  Administered 2016-06-12 – 2016-06-13 (×2): 81 mg via ORAL
  Filled 2016-06-12 (×2): qty 1

## 2016-06-12 MED ORDER — MIDAZOLAM HCL 5 MG/5ML IJ SOLN
INTRAMUSCULAR | Status: DC | PRN
  Administered 2016-06-12: 2 mg via INTRAVENOUS
  Administered 2016-06-12 (×2): 1 mg via INTRAVENOUS

## 2016-06-12 MED ORDER — PROPOFOL 10 MG/ML IV BOLUS
INTRAVENOUS | Status: AC
  Filled 2016-06-12: qty 40

## 2016-06-12 MED ORDER — BUPIVACAINE HCL (PF) 0.25 % IJ SOLN
INTRAMUSCULAR | Status: AC
  Filled 2016-06-12: qty 30

## 2016-06-12 MED ORDER — PHENOL 1.4 % MT LIQD
1.0000 | OROMUCOSAL | Status: DC | PRN
  Filled 2016-06-12: qty 177

## 2016-06-12 MED ORDER — DIPHENHYDRAMINE HCL 25 MG PO CAPS: 25.0000 mg | ORAL_CAPSULE | Freq: Four times a day (QID) | ORAL | Status: DC | PRN

## 2016-06-12 MED ORDER — ALUM & MAG HYDROXIDE-SIMETH 200-200-20 MG/5ML PO SUSP: 30.0000 mL | ORAL | Status: DC | PRN

## 2016-06-12 MED ORDER — DOCUSATE SODIUM 100 MG PO CAPS: 100.0000 mg | ORAL_CAPSULE | Freq: Two times a day (BID) | ORAL | 0 refills | Status: DC

## 2016-06-12 MED ORDER — STERILE WATER FOR IRRIGATION IR SOLN
Status: DC | PRN
  Administered 2016-06-12: 3000 mL

## 2016-06-12 MED ORDER — TIZANIDINE HCL 4 MG PO TABS: 4.0000 mg | ORAL_TABLET | Freq: Four times a day (QID) | ORAL | 0 refills | Status: DC | PRN

## 2016-06-12 MED ORDER — MIDAZOLAM HCL 2 MG/2ML IJ SOLN
INTRAMUSCULAR | Status: AC
  Filled 2016-06-12: qty 2

## 2016-06-12 MED ORDER — HYDROCODONE-ACETAMINOPHEN 7.5-325 MG PO TABS
1.0000 | ORAL_TABLET | ORAL | Status: DC
  Administered 2016-06-12 – 2016-06-13 (×6): 2 via ORAL
  Filled 2016-06-12 (×6): qty 2

## 2016-06-12 MED ORDER — BISACODYL 10 MG RE SUPP: 10.0000 mg | Freq: Every day | RECTAL | Status: DC | PRN

## 2016-06-12 MED ORDER — CEFAZOLIN SODIUM-DEXTROSE 2-4 GM/100ML-% IV SOLN
2.0000 g | INTRAVENOUS | Status: AC
  Administered 2016-06-12: 2 g via INTRAVENOUS

## 2016-06-12 MED ORDER — METOCLOPRAMIDE HCL 5 MG/ML IJ SOLN: 5.0000 mg | Freq: Three times a day (TID) | INTRAMUSCULAR | Status: DC | PRN

## 2016-06-12 MED ORDER — PROPOFOL 500 MG/50ML IV EMUL
INTRAVENOUS | Status: DC | PRN
  Administered 2016-06-12: 100 ug/kg/min via INTRAVENOUS

## 2016-06-12 MED ORDER — MAGNESIUM CITRATE PO SOLN: 1.0000 | Freq: Once | ORAL | Status: DC | PRN

## 2016-06-12 MED ORDER — HYDROMORPHONE HCL 1 MG/ML IJ SOLN
0.5000 mg | INTRAMUSCULAR | Status: DC | PRN
  Administered 2016-06-12: 0.5 mg via INTRAVENOUS
  Administered 2016-06-12 – 2016-06-13 (×5): 1 mg via INTRAVENOUS
  Filled 2016-06-12 (×6): qty 1

## 2016-06-12 MED ORDER — CEFAZOLIN SODIUM-DEXTROSE 2-4 GM/100ML-% IV SOLN
2.0000 g | Freq: Four times a day (QID) | INTRAVENOUS | Status: AC
  Administered 2016-06-12 – 2016-06-13 (×2): 2 g via INTRAVENOUS
  Filled 2016-06-12 (×2): qty 100

## 2016-06-12 MED ORDER — MENTHOL 3 MG MT LOZG: 1.0000 | LOZENGE | OROMUCOSAL | Status: DC | PRN

## 2016-06-12 MED ORDER — METHOCARBAMOL 500 MG PO TABS
500.0000 mg | ORAL_TABLET | Freq: Four times a day (QID) | ORAL | Status: DC | PRN
  Administered 2016-06-13 (×2): 500 mg via ORAL
  Filled 2016-06-12 (×2): qty 1

## 2016-06-12 MED ORDER — ONDANSETRON HCL 4 MG/2ML IJ SOLN
INTRAMUSCULAR | Status: DC | PRN
  Administered 2016-06-12: 4 mg via INTRAVENOUS

## 2016-06-12 MED ORDER — HYDROCODONE-ACETAMINOPHEN 7.5-325 MG PO TABS: 1.0000 | ORAL_TABLET | ORAL | 0 refills | Status: DC | PRN

## 2016-06-12 SURGICAL SUPPLY — 46 items
BAG DECANTER FOR FLEXI CONT (MISCELLANEOUS) IMPLANT
BAG ZIPLOCK 12X15 (MISCELLANEOUS) IMPLANT
BANDAGE ACE 6X5 VEL STRL LF (GAUZE/BANDAGES/DRESSINGS) ×3 IMPLANT
BLADE SAW SGTL 13.0X1.19X90.0M (BLADE) ×3 IMPLANT
BONE CEMENT GENTAMICIN (Cement) ×6 IMPLANT
BOWL SMART MIX CTS (DISPOSABLE) ×3 IMPLANT
CAPT KNEE TOTAL 3 ATTUNE ×3 IMPLANT
CEMENT BONE GENTAMICIN 40 (Cement) ×2 IMPLANT
CLOTH BEACON ORANGE TIMEOUT ST (SAFETY) ×3 IMPLANT
CUFF TOURN SGL QUICK 34 (TOURNIQUET CUFF) ×2
CUFF TRNQT CYL 34X4X40X1 (TOURNIQUET CUFF) ×1 IMPLANT
DECANTER SPIKE VIAL GLASS SM (MISCELLANEOUS) ×3 IMPLANT
DRAPE U-SHAPE 47X51 STRL (DRAPES) ×3 IMPLANT
DRESSING AQUACEL AG SP 3.5X10 (GAUZE/BANDAGES/DRESSINGS) ×1 IMPLANT
DRSG AQUACEL AG ADV 3.5X10 (GAUZE/BANDAGES/DRESSINGS) ×3 IMPLANT
DRSG AQUACEL AG SP 3.5X10 (GAUZE/BANDAGES/DRESSINGS) ×3
DURAPREP 26ML APPLICATOR (WOUND CARE) ×6 IMPLANT
ELECT REM PT RETURN 9FT ADLT (ELECTROSURGICAL) ×3
ELECTRODE REM PT RTRN 9FT ADLT (ELECTROSURGICAL) ×1 IMPLANT
GLOVE BIOGEL M 7.0 STRL (GLOVE) ×6 IMPLANT
GLOVE BIOGEL PI IND STRL 7.5 (GLOVE) ×7 IMPLANT
GLOVE BIOGEL PI IND STRL 8.5 (GLOVE) ×1 IMPLANT
GLOVE BIOGEL PI INDICATOR 7.5 (GLOVE) ×14
GLOVE BIOGEL PI INDICATOR 8.5 (GLOVE) ×2
GLOVE ECLIPSE 8.0 STRL XLNG CF (GLOVE) ×3 IMPLANT
GLOVE ORTHO TXT STRL SZ7.5 (GLOVE) ×6 IMPLANT
GOWN STRL REUS W/TWL LRG LVL3 (GOWN DISPOSABLE) ×3 IMPLANT
GOWN STRL REUS W/TWL XL LVL3 (GOWN DISPOSABLE) ×3 IMPLANT
HANDPIECE INTERPULSE COAX TIP (DISPOSABLE) ×2
LIQUID BAND (GAUZE/BANDAGES/DRESSINGS) ×3 IMPLANT
MANIFOLD NEPTUNE II (INSTRUMENTS) ×3 IMPLANT
PACK TOTAL KNEE CUSTOM (KITS) ×3 IMPLANT
POSITIONER SURGICAL ARM (MISCELLANEOUS) ×3 IMPLANT
SET HNDPC FAN SPRY TIP SCT (DISPOSABLE) ×1 IMPLANT
SET PAD KNEE POSITIONER (MISCELLANEOUS) ×3 IMPLANT
SUT MNCRL AB 4-0 PS2 18 (SUTURE) ×3 IMPLANT
SUT VIC AB 1 CT1 36 (SUTURE) ×3 IMPLANT
SUT VIC AB 2-0 CT1 27 (SUTURE) ×6
SUT VIC AB 2-0 CT1 TAPERPNT 27 (SUTURE) ×3 IMPLANT
SUT VLOC 180 0 24IN GS25 (SUTURE) ×3 IMPLANT
SYR 50ML LL SCALE MARK (SYRINGE) ×3 IMPLANT
TRAY FOLEY W/METER SILVER 14FR (SET/KITS/TRAYS/PACK) ×3 IMPLANT
TRAY FOLEY W/METER SILVER 16FR (SET/KITS/TRAYS/PACK) IMPLANT
WATER STERILE IRR 1500ML POUR (IV SOLUTION) ×3 IMPLANT
WRAP KNEE MAXI GEL POST OP (GAUZE/BANDAGES/DRESSINGS) ×3 IMPLANT
YANKAUER SUCT BULB TIP 10FT TU (MISCELLANEOUS) ×3 IMPLANT

## 2016-06-12 NOTE — Anesthesia Procedure Notes (Signed)
Spinal  Patient location during procedure: OR Start time: 06/12/2016 12:24 PM End time: 06/12/2016 12:26 PM Staffing Anesthesiologist: Linton RumpALLAN, JENNIFER DICKERSON Performed: anesthesiologist  Preanesthetic Checklist Completed: patient identified, surgical consent, pre-op evaluation, timeout performed, IV checked, risks and benefits discussed and monitors and equipment checked Spinal Block Patient position: sitting Prep: ChloraPrep Patient monitoring: heart rate, continuous pulse ox and blood pressure Approach: midline Location: L2-3 Injection technique: single-shot Needle Needle type: Pencan  Needle gauge: 24 G Needle length: 9 cm

## 2016-06-12 NOTE — Op Note (Signed)
NAME:  Tammy Hart                      MEDICAL RECORD NO.:  [id number]                             FACILITY:  [facility]      PHYSICIAN:  Madlyn Frankel. Charlann Boxer, M.D.  DATE OF BIRTH:  October 28, 1950      DATE OF PROCEDURE:  06/12/2016                                     OPERATIVE REPORT         PREOPERATIVE DIAGNOSIS:  Left knee osteoarthritis.      POSTOPERATIVE DIAGNOSIS:  Left knee osteoarthritis.      FINDINGS:  The patient was noted to have complete loss of cartilage and   bone-on-bone arthritis with associated osteophytes in all three compartments of   the knee with a significant synovitis and associated effusion.      PROCEDURE:  Left total knee replacement.      COMPONENTS USED:  DePuy Attune rotating platform posterior stabilized knee   system, a size 5 femur, 4 tibia, size 7 mm PS AOX insert, and 35 anatomic patellar   button.      SURGEON:  Madlyn Frankel. Charlann Boxer, M.D.      ASSISTANT:  Skip Mayer, PA-C.      ANESTHESIA:  Spinal.      SPECIMENS:  None.      COMPLICATION:  None.      DRAINS:  None.  EBL: <100cc      TOURNIQUET TIME:   Total Tourniquet Time Documented: Thigh (Left) - 31 minutes Total: Thigh (Left) - 31 minutes  .      The patient was stable to the recovery room.      INDICATION FOR PROCEDURE:  Tammy Hart is a 65 y.o. female patient of   mine.  The patient had been seen, evaluated, and treated conservatively in the   office with medication, activity modification, and injections.  The patient had   radiographic changes of bone-on-bone arthritis with endplate sclerosis and osteophytes noted.      The patient failed conservative measures including medication, injections, and activity modification, and at this point was ready for more definitive measures.   Based on the radiographic changes and failed conservative measures, the patient   decided to proceed with total knee replacement.  Risks of infection,   DVT, component failure, need for revision surgery,  postop course, and   expectations were all   discussed and reviewed.  Consent was obtained for benefit of pain   relief.      PROCEDURE IN DETAIL:  The patient was brought to the operative theater.   Once adequate anesthesia, preoperative antibiotics, 2 gm of Ancef, 1 gm of Tranexamic Acid, and 10 mg of Decadron administered, the patient was positioned supine with the left thigh tourniquet placed.  The  left lower extremity was prepped and draped in sterile fashion.  A time-   out was performed identifying the patient, planned procedure, and   extremity.      The left lower extremity was placed in the [facility] leg holder.  The leg was   exsanguinated, tourniquet elevated to 250 mmHg.  A midline incision was   made followed by  median parapatellar arthrotomy.  Following initial   exposure, attention was first directed to the patella.  Precut   measurement was noted to be 21 mm.  I resected down to 14 mm and used a   35 anatomic patellar button to restore patellar height as well as cover the cut   surface.      The lug holes were drilled and a metal shim was placed to protect the   patella from retractors and saw blades.      At this point, attention was now directed to the femur.  The femoral   canal was opened with a drill, irrigated to try to prevent fat emboli.  An   intramedullary rod was passed at 3 degrees valgus, 9 mm of bone was   resected off the distal femur.  Following this resection, the tibia was   subluxated anteriorly.  Using the extramedullary guide, 2 mm of bone was resected off   the proximal medial tibia.  We confirmed the gap would be   stable medially and laterally with a size 6 spacer block  as well as confirmed   the cut was perpendicular in the coronal plane, checking with an alignment rod.      Once this was done, I sized the femur to be a size 5 in the anterior-   posterior dimension, chose a standard component based on medial and   lateral dimension.  The size  5 rotation block was then pinned in   position anterior referenced using the C-clamp to set rotation.  The   anterior, posterior, and  chamfer cuts were made without difficulty nor   notching making certain that I was along the anterior cortex to help   with flexion gap stability.      The final box cut was made off the lateral aspect of distal femur.      At this point, the tibia was sized to be a size 4, the size 4 tray was   then pinned in position through the medial third of the tubercle,   drilled, and keel punched.  Trial reduction was now carried with a 5 femur,  4 tibia, a size 7 PS AOX insert, and the 35 anatomic patella botton.  The knee was brought to   extension, full extension with good flexion stability with the patella   tracking through the trochlea without application of pressure.  Given   all these findings the femoral lug holes were drilled and then the trial components removed.  Final components were   opened and cement was mixed.  The knee was irrigated with normal saline   solution and pulse lavage.  The synovial lining was   then injected with 30 cc of 0.25% Marcaine with epinephrine and 1 cc of Toradol plus 30 cc of NS for a    total of 61 cc.      The knee was irrigated.  Final implants were then cemented onto clean and   dried cut surfaces of bone with the knee brought to extension with a size 7 PS trial insert.      Once the cement had fully cured, the excess cement was removed   throughout the knee.  I confirmed I was satisfied with the range of   motion and stability, and the final size 7 PS AOX insert was chosen.  It was   placed into the knee.      The tourniquet had been let down at 31  minutes.  No significant   hemostasis required.  The   extensor mechanism was then reapproximated using #1 Vicryl and #0 V-lock sutures with the knee   in flexion.  The   remaining wound was closed with 2-0 Vicryl and running 4-0 Monocryl.   The knee was cleaned, dried,  dressed sterilely using Dermabond and   Aquacel dressing.  The patient was then   brought to recovery room in stable condition, tolerating the procedure   well.   Please note that Physician Assistant, Skip MayerBlair Roberts, PA-C, was present for the entirety of the case, and was utilized for pre-operative positioning, peri-operative retractor management, general facilitation of the procedure.  He was also utilized for primary wound closure at the end of the case.              Madlyn FrankelMatthew D. Charlann Boxerlin, M.D.    06/12/2016 1:55 PM

## 2016-06-12 NOTE — Transfer of Care (Signed)
Immediate Anesthesia Transfer of Care Note  Patient: Phillipa C Henshaw  Procedure(s) Performed: Procedure(s): LEFT TOTAL KNEE ARTHROPLASTY (Left)  Patient Location: PACU  Anesthesia Type:Spinal  Level of Consciousness: awake, alert  and oriented  Airway & Oxygen Therapy: Patient Spontanous Breathing and Patient connected to face mask oxygen  Post-op Assessment: Report given to RN and Post -op Vital signs reviewed and stable  Post vital signs: Reviewed and stable  Last Vitals:  Vitals:   06/12/16 0955  BP: (!) 157/81  Pulse: 73  Resp: 16  Temp: 36.4 C    Last Pain:  Vitals:   06/12/16 0955  TempSrc: Oral      Patients Stated Pain Goal: 4 (06/12/16 1013)  Complications: No apparent anesthesia complications

## 2016-06-12 NOTE — Anesthesia Preprocedure Evaluation (Addendum)
Anesthesia Evaluation  Patient identified by MRN, date of birth, ID band Patient awake    Reviewed: Allergy & Precautions, NPO status , Patient's Chart, lab work & pertinent test results  History of Anesthesia Complications Negative for: history of anesthetic complications  Airway Mallampati: I  TM Distance: >3 FB Neck ROM: Full    Dental  (+) Teeth Intact, Dental Advisory Given   Pulmonary neg shortness of breath, neg sleep apnea, neg COPD, neg recent URI, former smoker,    Pulmonary exam normal breath sounds clear to auscultation       Cardiovascular (-) hypertension(-) angina(-) Past MI, (-) Cardiac Stents and (-) Orthopnea + dysrhythmias (incomplete RBBB)  Rhythm:Regular Rate:Normal     Neuro/Psych neg Seizures negative neurological ROS     GI/Hepatic negative GI ROS, Neg liver ROS,   Endo/Other  neg diabetesprediabetes  Renal/GU negative Renal ROS     Musculoskeletal  (+) Arthritis ,   Abdominal (+) - obese,   Peds  Hematology negative hematology ROS (+)   Anesthesia Other Findings   Reproductive/Obstetrics                            Anesthesia Physical Anesthesia Plan  ASA: II  Anesthesia Plan: Spinal   Post-op Pain Management:    Induction: Intravenous  Airway Management Planned: Natural Airway and Simple Face Mask  Additional Equipment:   Intra-op Plan:   Post-operative Plan:   Informed Consent: I have reviewed the patients History and Physical, chart, labs and discussed the procedure including the risks, benefits and alternatives for the proposed anesthesia with the patient or authorized representative who has indicated his/her understanding and acceptance.   Dental advisory given  Plan Discussed with: CRNA  Anesthesia Plan Comments: (I have discussed risks of neuraxial anesthesia including but not limited to infection, bleeding, nerve injury, back pain, headache,  seizures, and failure of block. Patient denies bleeding disorders and is not currently anticoagulated. Labs have been reviewed. Risks and benefits discussed. All patient's questions answered.   Platelets 301)       Anesthesia Quick Evaluation

## 2016-06-12 NOTE — Anesthesia Postprocedure Evaluation (Signed)
Anesthesia Post Note  Patient: Tammy Hart  Procedure(s) Performed: Procedure(s) (LRB): LEFT TOTAL KNEE ARTHROPLASTY (Left)  Patient location during evaluation: PACU Anesthesia Type: Spinal Level of consciousness: oriented and awake and alert Pain management: pain level controlled Vital Signs Assessment: post-procedure vital signs reviewed and stable Respiratory status: spontaneous breathing, respiratory function stable and nonlabored ventilation Cardiovascular status: blood pressure returned to baseline and stable Postop Assessment: no headache and no backache Anesthetic complications: no    Last Vitals:  Vitals:   06/12/16 1515 06/12/16 1525  BP: 124/67 123/79  Pulse: 73 70  Resp: 20 17  Temp: 36.6 C 36.4 C    Last Pain:  Vitals:   06/12/16 1545  TempSrc:   PainSc: 6                  Linton RumpJennifer Dickerson Dianely Krehbiel

## 2016-06-12 NOTE — Discharge Instructions (Signed)

## 2016-06-12 NOTE — Interval H&P Note (Signed)
History and Physical Interval Note:  06/12/2016 11:08 AM  Tammy Hart  has presented today for surgery, with the diagnosis of LEFT KNEE OA   The various methods of treatment have been discussed with the patient and family. After consideration of risks, benefits and other options for treatment, the patient has consented to  Procedure(s): LEFT TOTAL KNEE ARTHROPLASTY (Left) as a surgical intervention .  The patient's history has been reviewed, patient examined, no change in status, stable for surgery.  I have reviewed the patient's chart and labs.  Questions were answered to the patient's satisfaction.     Shelda PalLIN,Desare Duddy D

## 2016-06-13 DIAGNOSIS — E669 Obesity, unspecified: Secondary | ICD-10-CM | POA: Diagnosis present

## 2016-06-13 LAB — BASIC METABOLIC PANEL
Anion gap: 5 (ref 5–15)
BUN: 13 mg/dL (ref 6–20)
CALCIUM: 8.3 mg/dL — AB (ref 8.9–10.3)
CO2: 29 mmol/L (ref 22–32)
Chloride: 104 mmol/L (ref 101–111)
Creatinine, Ser: 0.75 mg/dL (ref 0.44–1.00)
GFR calc Af Amer: >60 mL/min (ref >60)
GLUCOSE: 110 mg/dL — AB (ref 65–99)
Potassium: 4.2 mmol/L (ref 3.5–5.1)
Sodium: 138 mmol/L (ref 135–145)

## 2016-06-13 LAB — CBC
HCT: 34.5 % — ABNORMAL LOW (ref 36.0–46.0)
Hemoglobin: 11.1 g/dL — ABNORMAL LOW (ref 12.0–15.0)
MCH: 30 pg (ref 26.0–34.0)
MCHC: 32.2 g/dL (ref 30.0–36.0)
MCV: 93.2 fL (ref 78.0–100.0)
PLATELETS: 234 10*3/uL (ref 150–400)
RBC: 3.7 MIL/uL — ABNORMAL LOW (ref 3.87–5.11)
RDW: 14.1 % (ref 11.5–15.5)
WBC: 8.4 10*3/uL (ref 4.0–10.5)

## 2016-06-13 NOTE — Evaluation (Signed)
Physical Therapy Evaluation Patient Details Name: Tammy Hart MRN: [id number] DOB: 01/31/1951 Today's Date: 06/13/2016   History of Present Illness  s/p L TKA  Clinical Impression  Pt s/p L TKR presents with decreased L LE strength/ROM and post op pain limiting functional mobility.  Pt should progress to dc home with family assist and plans OP PT starting 06/15/18    Follow Up Recommendations Outpatient PT    Equipment Recommendations  None recommended by PT    Recommendations for Other Services OT consult     Precautions / Restrictions Precautions Precautions: Knee Restrictions Weight Bearing Restrictions: No Other Position/Activity Restrictions: WBAT      Mobility  Bed Mobility Overal bed mobility: Needs Assistance Bed Mobility: Supine to Sit     Supine to sit: Min guard     General bed mobility comments: for L LE  with VC for sequence  Transfers Overall transfer level: Needs assistance Equipment used: Rolling walker (2 wheeled) Transfers: Sit to/from Stand Sit to Stand: Min assist         General transfer comment: cues for LE management and use of UEs to self assist  Ambulation/Gait Ambulation/Gait assistance: Min assist Ambulation Distance (Feet): 45 Feet (and 15' into bathroom) Assistive device: Rolling walker (2 wheeled) Gait Pattern/deviations: Step-to pattern;Decreased step length - right;Decreased step length - left;Shuffle;Trunk flexed Gait velocity: decr Gait velocity interpretation: Below normal speed for age/gender General Gait Details: cues for sequence, posture and position from [employer]  Stairs            Wheelchair Mobility    Modified Rankin (Stroke Patients Only)       Balance                                             Pertinent Vitals/Pain Pain Assessment: 0-10 Pain Score: 6  Pain Location: L knee Pain Descriptors / Indicators: Aching;Sore Pain Intervention(s): Limited activity within patient's  tolerance;Monitored during session;Premedicated before session;Ice applied    Home Living Family/patient expects to be discharged to:: Private residence Living Arrangements: Spouse/significant other Available Help at Discharge: Family Type of Home: House Home Access: Stairs to enter Entrance Stairs-Rails: Right Entrance Stairs-Number of Steps: 2 Home Layout: One level Home Equipment: Environmental consultantWalker - 2 wheels;Shower seat - built in;Grab bars - tub/shower;Hand held shower head      Prior Function Level of Independence: Independent               Hand Dominance        Extremity/Trunk Assessment   Upper Extremity Assessment: Overall WFL for tasks assessed           Lower Extremity Assessment: LLE deficits/detail      Cervical / Trunk Assessment: Normal  Communication   Communication: No difficulties  Cognition Arousal/Alertness: Awake/alert Behavior During Therapy: WFL for tasks assessed/performed Overall Cognitive Status: Within Functional Limits for tasks assessed                      General Comments      Exercises        Assessment/Plan    PT Assessment Patient needs continued PT services  PT Diagnosis Difficulty walking   PT Problem List Decreased strength;Decreased range of motion;Decreased activity tolerance;Decreased mobility;Pain;Decreased knowledge of use of DME  PT Treatment Interventions DME instruction;Gait training;Stair training;Functional mobility training;Therapeutic activities;Therapeutic exercise;Patient/family education  PT Goals (Current goals can be found in the Care Plan section) Acute Rehab PT Goals Patient Stated Goal: home today PT Goal Formulation: With patient Time For Goal Achievement: 06/14/16 Potential to Achieve Goals: Good    Frequency 7X/week   Barriers to discharge        Co-evaluation               End of Session Equipment Utilized During Treatment: Gait belt Activity Tolerance: Patient tolerated  treatment well Patient left: in chair;with call bell/phone within reach;with family/visitor present Nurse Communication: Mobility status         Time: 4098-1191 PT Time Calculation (min) (ACUTE ONLY): 23 min   Charges:   PT Evaluation $PT Eval Low Complexity: 1 Procedure PT Treatments $Gait Training: 8-22 mins   PT G Codes:        Tammy Hart July 11, 2016, 12:42 PM

## 2016-06-13 NOTE — Progress Notes (Signed)
Physical Therapy Treatment Patient Details Name: Tammy Hart MRN: [id number] DOB: 08/28/1951 Today's Date: 06/13/2016    History of Present Illness s/p L TKA    PT Comments    Reviewed home therex, stairs, and car transfers with pt and spouse.  Pt eager for dc to home.  Follow Up Recommendations  Outpatient PT     Equipment Recommendations  None recommended by PT    Recommendations for Other Services OT consult     Precautions / Restrictions Precautions Precautions: Knee Restrictions Weight Bearing Restrictions: No Other Position/Activity Restrictions: WBAT    Mobility  Bed Mobility Overal bed mobility: Needs Assistance Bed Mobility: Supine to Sit     Supine to sit: Min guard     General bed mobility comments: NT - Pt OOB and declilnes back to bed  Transfers Overall transfer level: Needs assistance Equipment used: Rolling walker (2 wheeled) Transfers: Sit to/from Stand Sit to Stand: Min guard;Supervision         General transfer comment: cues for LE management and use of UEs to self assist  Ambulation/Gait Ambulation/Gait assistance: Min guard;Supervision Ambulation Distance (Feet): 45 Feet Assistive device: Rolling walker (2 wheeled) Gait Pattern/deviations: Step-to pattern;Decreased step length - right;Decreased step length - left;Shuffle;Trunk flexed Gait velocity: decr Gait velocity interpretation: Below normal speed for age/gender General Gait Details: cues for sequence, posture and position from RW   Stairs Stairs: Yes Stairs assistance: Min assist Stair Management: One rail Right;Forwards;With crutches;Step to pattern Number of Stairs: 5 General stair comments: cues for sequence and foot/crutch placement.  Husband assisting and written instructions provided  Wheelchair Mobility    Modified Rankin (Stroke Patients Only)       Balance                                    Cognition Arousal/Alertness:  Awake/alert Behavior During Therapy: WFL for tasks assessed/performed Overall Cognitive Status: Within Functional Limits for tasks assessed                      Exercises Total Joint Exercises Ankle Circles/Pumps: AROM;Both;15 reps;Supine Quad Sets: AROM;Both;15 reps;Supine Heel Slides: AAROM;Left;15 reps;Supine Straight Leg Raises: AAROM;AROM;Left;Supine;10 reps Goniometric ROM: L knee AAROM -8 - 60    General Comments        Pertinent Vitals/Pain Pain Assessment: 0-10 Pain Score: 5  Pain Location: L knee Pain Descriptors / Indicators: Aching;Sore Pain Intervention(s): Limited activity within patient's tolerance;Monitored during session;Premedicated before session;Ice applied    Home Living Family/patient expects to be discharged to:: Private residence Living Arrangements: Spouse/significant other Available Help at Discharge: Family Type of Home: House Home Access: Stairs to enter Entrance Stairs-Rails: Right Home Layout: One level Home Equipment: Environmental consultantWalker - 2 wheels;Shower seat - built in;Grab bars - tub/shower;Hand held shower head      Prior Function Level of Independence: Independent          PT Goals (current goals can now be found in the care plan section) Acute Rehab PT Goals Patient Stated Goal: home today PT Goal Formulation: With patient Time For Goal Achievement: 06/14/16 Potential to Achieve Goals: Good Progress towards PT goals: Progressing toward goals    Frequency  7X/week    PT Plan Current plan remains appropriate    Co-evaluation             End of Session Equipment Utilized During Treatment: Gait belt Activity Tolerance: Patient  tolerated treatment well Patient left: in chair;with call bell/phone within reach;with family/visitor present     Time: 1325-1405 PT Time Calculation (min) (ACUTE ONLY): 40 min  Charges:  $Gait Training: 8-22 mins $Therapeutic Exercise: 8-22 mins $Therapeutic Activity: 8-22 mins                     G Codes:      Tammy Hart 24-Jun-2016, 3:15 PM

## 2016-06-13 NOTE — Progress Notes (Signed)
Occupational Therapy Evaluation Patient Details Name: Tammy Hart MRN: [id number] DOB: 05/19/1951 Today's Date: 06/13/2016    History of Present Illness s/p L TKA   Clinical Impression   All OT education completed and pt questions answered. No further OT needs identified. Will sign off.    Follow Up Recommendations  No OT follow up    Equipment Recommendations  None recommended by OT    Recommendations for Other Services       Precautions / Restrictions Precautions Precautions: Knee Restrictions Weight Bearing Restrictions: No Other Position/Activity Restrictions: WBAT      Mobility Bed Mobility                  Transfers                      Balance                                            ADL Overall ADL's : Needs assistance/impaired Eating/Feeding: Independent;Sitting   Grooming: Set up;Sitting           Upper Body Dressing : Set up;Sitting   Lower Body Dressing: Minimal assistance;Sit to/from stand   Toilet Transfer: Min guard;Ambulation;BSC;RW           Functional mobility during ADLs: Min guard;Rolling walker General ADL Comments: Educated pt on LB dressing techniques. She does not wear socks and wears slip on shoes with no laces. Educated her to sit to start pants/underpants, dress operated leg first, then stand to pull up. Patient states she prefers to stand to don underwear, educated that if she wanted to do it that way that her husband had to be there to assist as well as the RW. She verbalized understanding. We reviewed shower transfer technique with grab bars. She verbalized understanding. She has been toileting with nursing this am without difficulty and has handicapped height toilets at home. They have a grab bar that can be installed near the toilet if needed. No further OT needs identified. Will sign off.     Vision     Perception     Praxis      Pertinent Vitals/Pain Pain Assessment:  0-10 Pain Score: 8  Pain Location: L knee Pain Descriptors / Indicators: Aching;Sore Pain Intervention(s): Limited activity within patient's tolerance;Patient requesting pain meds-RN notified     Hand Dominance     Extremity/Trunk Assessment Upper Extremity Assessment Upper Extremity Assessment: Overall WFL for tasks assessed   Lower Extremity Assessment Lower Extremity Assessment: Defer to PT evaluation       Communication Communication Communication: No difficulties   Cognition Arousal/Alertness: Awake/alert Behavior During Therapy: WFL for tasks assessed/performed Overall Cognitive Status: Within Functional Limits for tasks assessed                     General Comments       Exercises       Shoulder Instructions      Home Living Family/patient expects to be discharged to:: Private residence Living Arrangements: Spouse/significant other Available Help at Discharge: Family Type of Home: House Home Access: Stairs to enter Secretary/administratorntrance Stairs-Number of Steps: 2 Entrance Stairs-Rails: Right Home Layout: One level     Bathroom Shower/Tub: Producer, television/film/videoWalk-in shower   Bathroom Toilet: Handicapped height Bathroom Accessibility: Yes How Accessible: Accessible via walker Home Equipment: Walker - 2 wheels;Shower  seat - built in;Grab bars - tub/shower;Hand held shower head          Prior Functioning/Environment Level of Independence: Independent             OT Diagnosis: Acute pain   OT Problem List: Decreased strength;Decreased range of motion;Pain   OT Treatment/Interventions:      OT Goals(Current goals can be found in the care plan section) Acute Rehab OT Goals Patient Stated Goal: home today OT Goal Formulation: All assessment and education complete, DC therapy  OT Frequency:     Barriers to D/C:            Co-evaluation              End of Session Nurse Communication: Patient requests pain meds  Activity Tolerance: Patient tolerated  treatment well Patient left: in chair;with call bell/phone within reach;with family/visitor present   Time: 1048-1101 OT Time Calculation (min): 13 min Charges:  OT General Charges $OT Visit: 1 Procedure OT Evaluation $OT Eval Low Complexity: 1 Procedure G-Codes:    Jackline Castilla A 2016-07-05, 11:42 AM

## 2016-06-13 NOTE — Progress Notes (Signed)
Physical Therapy Treatment Patient Details Name: Tammy Hart MRN: [id number] DOB: 1950-12-25 Today's Date: 06/13/2016    History of Present Illness s/p L TKA    PT Comments    Pt therex program initiated.  Follow Up Recommendations  Outpatient PT     Equipment Recommendations  None recommended by PT    Recommendations for Other Services OT consult     Precautions / Restrictions Precautions Precautions: Knee Restrictions Weight Bearing Restrictions: No Other Position/Activity Restrictions: WBAT    Mobility  Bed Mobility Overal bed mobility: Needs Assistance Bed Mobility: Supine to Sit     Supine to sit: Min guard     General bed mobility comments: for L LE  with VC for sequence  Transfers Overall transfer level: Needs assistance Equipment used: Rolling walker (2 wheeled) Transfers: Sit to/from Stand Sit to Stand: Min assist         General transfer comment: cues for LE management and use of UEs to self assist  Ambulation/Gait Ambulation/Gait assistance: Min assist Ambulation Distance (Feet): 45 Feet (and 15' into bathroom) Assistive device: Rolling walker (2 wheeled) Gait Pattern/deviations: Step-to pattern;Decreased step length - right;Decreased step length - left;Shuffle;Trunk flexed Gait velocity: decr Gait velocity interpretation: Below normal speed for age/gender General Gait Details: cues for sequence, posture and position from [employer]            Wheelchair Mobility    Modified Rankin (Stroke Patients Only)       Balance                                    Cognition Arousal/Alertness: Awake/alert Behavior During Therapy: WFL for tasks assessed/performed Overall Cognitive Status: Within Functional Limits for tasks assessed                      Exercises Total Joint Exercises Ankle Circles/Pumps: AROM;Both;15 reps;Supine Quad Sets: AROM;Both;15 reps;Supine Heel Slides: AAROM;Left;15  reps;Supine Straight Leg Raises: AAROM;AROM;Left;15 reps;Supine Goniometric ROM: L knee AAROM -8 - 60    General Comments        Pertinent Vitals/Pain Pain Assessment: 0-10 Pain Score: 6  Pain Location: L knee Pain Descriptors / Indicators: Aching;Sore Pain Intervention(s): Limited activity within patient's tolerance;Monitored during session;Premedicated before session;Ice applied    Home Living Family/patient expects to be discharged to:: Private residence Living Arrangements: Spouse/significant other Available Help at Discharge: Family Type of Home: House Home Access: Stairs to enter Entrance Stairs-Rails: Right Home Layout: One level Home Equipment: Environmental consultant - 2 wheels;Shower seat - built in;Grab bars - tub/shower;Hand held shower head      Prior Function Level of Independence: Independent          PT Goals (current goals can now be found in the care plan section) Acute Rehab PT Goals Patient Stated Goal: home today PT Goal Formulation: With patient Time For Goal Achievement: 06/14/16 Potential to Achieve Goals: Good Progress towards PT goals: Progressing toward goals    Frequency  7X/week    PT Plan Current plan remains appropriate    Co-evaluation             End of Session Equipment Utilized During Treatment: Gait belt Activity Tolerance: Patient tolerated treatment well Patient left: in chair;with call bell/phone within reach;with family/visitor present     Time: 4098-1191 PT Time Calculation (min) (ACUTE ONLY): 17 min  Charges:  $Gait Training: 8-22 mins $Therapeutic  Exercise: 8-22 mins                    G Codes:      Ercole Georg 06/13/2016, 12:48 PM

## 2016-06-13 NOTE — Care Management Note (Signed)
Case Management Note  Patient Details  Name: Shalia MARCELE KOSTA MRN: [id number] Date of Birth: 21-Sep-1951  Subjective/Objective:                    Action/Plan:   Expected Discharge Date:                  Expected Discharge Plan:  Home/Self Care  In-House Referral:     Discharge planning Services  CM Consult  Post Acute Care Choice:    Choice offered to:  Patient  DME Arranged:  N/A DME Agency:  NA  HH Arranged:  NA HH Agency:  NA  Status of Service:  Completed, signed off  If discussed at [place] of Stay Meetings, dates discussed:    Additional Comments: CM met with pt to confirm outpt PT is plan; pt confirms.  Pt states she has a rolling walker at home and an elevated commode and does not need a 3n1.  No other CM needs were communicated. Dellie Catholic, RN 06/13/2016, 11:20 AM

## 2016-06-13 NOTE — Progress Notes (Signed)
     Subjective: 1 Day Post-Op Procedure(s) (LRB): LEFT TOTAL KNEE ARTHROPLASTY (Left)   Patient reports pain as mild, pain controlled. No events throughout the night.  Ready to be discharged home, if she does well with PT.  Objective:   VITALS:   Vitals:   06/13/16 0125 06/13/16 0550  BP: (!) 113/56 (!) 105/56  Pulse: 78 68  Resp: 16 16  Temp: 98.5 F (36.9 C) 97.6 F (36.4 C)    Dorsiflexion/Plantar flexion intact Incision: dressing C/D/I No cellulitis present Compartment soft  LABS  Recent Labs  06/13/16 0442  HGB 11.1*  HCT 34.5*  WBC 8.4  PLT 234     Recent Labs  06/13/16 0442  NA 138  K 4.2  BUN 13  CREATININE 0.75  GLUCOSE 110*     Assessment/Plan: 1 Day Post-Op Procedure(s) (LRB): LEFT TOTAL KNEE ARTHROPLASTY (Left) Foley cath d/c'ed Advance diet Up with therapy D/C IV fluids Discharge home with home health  [facility]. Follow up with OLIN,Amand Lemoine D in 2 weeks.  Contact information:  [facility] [address], [address] [place] [id number] [phone]    Obese (BMI 30-39.9) Estimated body mass index is 30.99 kg/m as calculated from the following:   Height as of this encounter: 5\' 6"  (1.676 m).   Weight as of this encounter: 87.1 kg (192 lb). Patient also counseled that weight may inhibit the healing process Patient counseled that losing weight will help with future health issues       Anastasio AuerbachMatthew S. Reannah Totten   PAC  06/13/2016, 8:49 AM

## 2016-06-15 DIAGNOSIS — M25562 Pain in left knee: Secondary | ICD-10-CM | POA: Diagnosis not present

## 2016-06-15 DIAGNOSIS — M25662 Stiffness of left knee, not elsewhere classified: Secondary | ICD-10-CM | POA: Diagnosis not present

## 2016-06-15 DIAGNOSIS — R262 Difficulty in walking, not elsewhere classified: Secondary | ICD-10-CM | POA: Diagnosis not present

## 2016-06-16 NOTE — Discharge Summary (Signed)
Physician Discharge Summary  Patient ID: Tammy Hart MRN: [id number] DOB/AGE: 67/11/1950 67 y.o.  Admit date: 06/12/2016 Discharge date: 06/13/2016   Procedures:  Procedure(s) (LRB): LEFT TOTAL KNEE ARTHROPLASTY (Left)  Attending Physician:  Dr. Durene RomansMatthew Olin   Admission Diagnoses:   Left knee primary OA / pain  Discharge Diagnoses:  Principal Problem:   S/P left TKA Active Problems:   Obese  Past Medical History:  Diagnosis Date  . Allergy   . Arthritis    neck (mainly), other joints mildly  . Degenerative joint disease of left knee   . Pre-diabetes     HPI:    Tammy Hart, 65 y.o. female, has a history of pain and functional disability in the left knee due to arthritis and has failed non-surgical conservative treatments for greater than 12 weeks to includeNSAID's and/or analgesics, corticosteriod injections and activity modification.  Onset of symptoms was gradual, starting 5-6 years ago with gradually worsening course since that time. The patient noted no past surgery on the left knee(s).  Patient currently rates pain in the left knee(s) at 10 out of 10 with activity. Patient has worsening of pain with activity and weight bearing, pain that interferes with activities of daily living, pain with passive range of motion, crepitus and joint swelling.  Patient has evidence of periarticular osteophytes and joint space narrowing by imaging studies.  There is no active infection.   Risks, benefits and expectations were discussed with the patient.  Risks including but not limited to the risk of anesthesia, blood clots, nerve damage, blood vessel damage, failure of the prosthesis, infection and up to and including death.  Patient understand the risks, benefits and expectations and wishes to proceed with surgery.   PCP: [name], PA   Discharged Condition: good  Hospital Course:  Patient underwent the above stated procedure on 06/12/2016. Patient tolerated the procedure well and  brought to the recovery room in good condition and subsequently to the floor.  POD #1 BP: 105/56 ; Pulse: 68 ; Temp: 97.6 F (36.4 C) ; Resp: 16 Patient reports pain as mild, pain controlled. No events throughout the night.  Ready to be discharged home. Dorsiflexion/plantar flexion intact, incision: dressing C/D/I, no cellulitis present and compartment soft.   LABS  Basename    HGB     11.1  HCT     34.5     Discharge Exam: General appearance: alert, cooperative and no distress Extremities: Homans sign is negative, no sign of DVT, no edema, redness or tenderness in the calves or thighs and no ulcers, gangrene or trophic changes  Disposition: Home with follow up in 2 weeks   Follow-up Information    Shelda PalLIN,Kazoua Gossen D, MD. Schedule an appointment as soon as possible for a visit in 2 week(s).   Specialty:  Orthopedic Surgery Contact information: [address] [place] [id number] [phone]           Discharge Instructions    Call MD / Call 911    Complete by:  As directed    If you experience chest pain or shortness of breath, CALL 911 and be transported to the hospital emergency room.  If you develope a fever above 101 F, pus (white drainage) or increased drainage or redness at the wound, or calf pain, call your surgeon's office.   Change dressing    Complete by:  As directed    Maintain surgical dressing until follow up in the clinic. If the edges  start to pull up, may reinforce with tape. If the dressing is no longer working, may remove and cover with gauze and tape, but must keep the area dry and clean.  Call with any questions or concerns.   Constipation Prevention    Complete by:  As directed    Drink plenty of fluids.  Prune juice may be helpful.  You may use a stool softener, such as Colace (over the counter) 100 mg twice a day.  Use MiraLax (over the counter) for constipation as needed.   Diet - low sodium heart healthy    Complete by:  As  directed    Discharge instructions    Complete by:  As directed    Maintain surgical dressing until follow up in the clinic. If the edges start to pull up, may reinforce with tape. If the dressing is no longer working, may remove and cover with gauze and tape, but must keep the area dry and clean.  Follow up in 2 weeks at [facility]. Call with any questions or concerns.   Increase activity slowly as tolerated    Complete by:  As directed    Weight bearing as tolerated with assist device (walker, cane, etc) as directed, use it as long as suggested by your surgeon or therapist, typically at least 4-6 weeks.   TED hose    Complete by:  As directed    Use stockings (TED hose) for 2 weeks on both leg(s).  You may remove them at night for sleeping.        Medication List    STOP taking these medications   meloxicam 15 MG tablet Commonly known as:  MOBIC   traMADol 50 MG tablet Commonly known as:  ULTRAM     TAKE these medications   aspirin 81 MG chewable tablet Chew 1 tablet (81 mg total) by mouth 2 (two) times daily. Take for 4 weeks.   diazepam 5 MG tablet Commonly known as:  VALIUM TAKE 1/2 TO 1 TABLET BY MOUTH EVERY NIGHT AT BEDTIME AS NEEDED What changed:  how much to take  how to take this  when to take this  reasons to take this  additional instructions   docusate sodium 100 MG capsule Commonly known as:  COLACE Take 1 capsule (100 mg total) by mouth 2 (two) times daily.   ferrous sulfate 325 (65 FE) MG tablet Take 1 tablet (325 mg total) by mouth 3 (three) times daily after meals.   fluticasone 50 MCG/ACT nasal spray Commonly known as:  FLONASE Place 1 spray into both nostrils daily as needed for allergies or rhinitis. allergies   HYDROcodone-acetaminophen 7.5-325 MG tablet Commonly known as:  NORCO Take 1-2 tablets by mouth every 4 (four) hours as needed for moderate pain.   polyethylene glycol packet Commonly known as:  MIRALAX /  GLYCOLAX Take 17 g by mouth 2 (two) times daily.   tiZANidine 4 MG tablet Commonly known as:  ZANAFLEX Take 1 tablet (4 mg total) by mouth every 6 (six) hours as needed for muscle spasms.   WAL-ITIN D 24 HOUR 10-240 MG 24 hr tablet Generic drug:  loratadine-pseudoephedrine TAKE 1 TABLET BY MOUTH DAILY AS NEEDED FOR CONGESTION        Signed: Anastasio Auerbach. Shian Goodnow   PA-C  06/16/2016, 3:05 PM

## 2016-06-18 DIAGNOSIS — M25562 Pain in left knee: Secondary | ICD-10-CM | POA: Diagnosis not present

## 2016-06-18 DIAGNOSIS — R262 Difficulty in walking, not elsewhere classified: Secondary | ICD-10-CM | POA: Diagnosis not present

## 2016-06-18 DIAGNOSIS — M25662 Stiffness of left knee, not elsewhere classified: Secondary | ICD-10-CM | POA: Diagnosis not present

## 2016-06-20 DIAGNOSIS — M25562 Pain in left knee: Secondary | ICD-10-CM | POA: Diagnosis not present

## 2016-06-20 DIAGNOSIS — R262 Difficulty in walking, not elsewhere classified: Secondary | ICD-10-CM | POA: Diagnosis not present

## 2016-06-20 DIAGNOSIS — M25662 Stiffness of left knee, not elsewhere classified: Secondary | ICD-10-CM | POA: Diagnosis not present

## 2016-06-22 DIAGNOSIS — M25562 Pain in left knee: Secondary | ICD-10-CM | POA: Diagnosis not present

## 2016-06-22 DIAGNOSIS — M25662 Stiffness of left knee, not elsewhere classified: Secondary | ICD-10-CM | POA: Diagnosis not present

## 2016-06-22 DIAGNOSIS — R262 Difficulty in walking, not elsewhere classified: Secondary | ICD-10-CM | POA: Diagnosis not present

## 2016-06-25 DIAGNOSIS — M25562 Pain in left knee: Secondary | ICD-10-CM | POA: Diagnosis not present

## 2016-06-25 DIAGNOSIS — R262 Difficulty in walking, not elsewhere classified: Secondary | ICD-10-CM | POA: Diagnosis not present

## 2016-06-25 DIAGNOSIS — M25662 Stiffness of left knee, not elsewhere classified: Secondary | ICD-10-CM | POA: Diagnosis not present

## 2016-06-27 DIAGNOSIS — Z471 Aftercare following joint replacement surgery: Secondary | ICD-10-CM | POA: Diagnosis not present

## 2016-06-27 DIAGNOSIS — Z96652 Presence of left artificial knee joint: Secondary | ICD-10-CM | POA: Diagnosis not present

## 2016-06-29 DIAGNOSIS — R262 Difficulty in walking, not elsewhere classified: Secondary | ICD-10-CM | POA: Diagnosis not present

## 2016-06-29 DIAGNOSIS — M25562 Pain in left knee: Secondary | ICD-10-CM | POA: Diagnosis not present

## 2016-06-29 DIAGNOSIS — M25662 Stiffness of left knee, not elsewhere classified: Secondary | ICD-10-CM | POA: Diagnosis not present

## 2016-07-03 DIAGNOSIS — M25562 Pain in left knee: Secondary | ICD-10-CM | POA: Diagnosis not present

## 2016-07-03 DIAGNOSIS — R262 Difficulty in walking, not elsewhere classified: Secondary | ICD-10-CM | POA: Diagnosis not present

## 2016-07-03 DIAGNOSIS — M25662 Stiffness of left knee, not elsewhere classified: Secondary | ICD-10-CM | POA: Diagnosis not present

## 2016-07-04 ENCOUNTER — Other Ambulatory Visit: Payer: Self-pay | Admitting: Family Medicine

## 2016-07-05 DIAGNOSIS — R262 Difficulty in walking, not elsewhere classified: Secondary | ICD-10-CM | POA: Diagnosis not present

## 2016-07-05 DIAGNOSIS — M25662 Stiffness of left knee, not elsewhere classified: Secondary | ICD-10-CM | POA: Diagnosis not present

## 2016-07-05 DIAGNOSIS — M25562 Pain in left knee: Secondary | ICD-10-CM | POA: Diagnosis not present

## 2016-07-10 DIAGNOSIS — M25562 Pain in left knee: Secondary | ICD-10-CM | POA: Diagnosis not present

## 2016-07-10 DIAGNOSIS — R262 Difficulty in walking, not elsewhere classified: Secondary | ICD-10-CM | POA: Diagnosis not present

## 2016-07-10 DIAGNOSIS — M25662 Stiffness of left knee, not elsewhere classified: Secondary | ICD-10-CM | POA: Diagnosis not present

## 2016-07-12 DIAGNOSIS — M25562 Pain in left knee: Secondary | ICD-10-CM | POA: Diagnosis not present

## 2016-07-12 DIAGNOSIS — M25662 Stiffness of left knee, not elsewhere classified: Secondary | ICD-10-CM | POA: Diagnosis not present

## 2016-07-12 DIAGNOSIS — R262 Difficulty in walking, not elsewhere classified: Secondary | ICD-10-CM | POA: Diagnosis not present

## 2016-07-17 DIAGNOSIS — R262 Difficulty in walking, not elsewhere classified: Secondary | ICD-10-CM | POA: Diagnosis not present

## 2016-07-17 DIAGNOSIS — M25662 Stiffness of left knee, not elsewhere classified: Secondary | ICD-10-CM | POA: Diagnosis not present

## 2016-07-17 DIAGNOSIS — M25562 Pain in left knee: Secondary | ICD-10-CM | POA: Diagnosis not present

## 2016-07-19 DIAGNOSIS — R262 Difficulty in walking, not elsewhere classified: Secondary | ICD-10-CM | POA: Diagnosis not present

## 2016-07-19 DIAGNOSIS — M25662 Stiffness of left knee, not elsewhere classified: Secondary | ICD-10-CM | POA: Diagnosis not present

## 2016-07-19 DIAGNOSIS — M25562 Pain in left knee: Secondary | ICD-10-CM | POA: Diagnosis not present

## 2016-07-23 DIAGNOSIS — M25662 Stiffness of left knee, not elsewhere classified: Secondary | ICD-10-CM | POA: Diagnosis not present

## 2016-07-23 DIAGNOSIS — M25562 Pain in left knee: Secondary | ICD-10-CM | POA: Diagnosis not present

## 2016-07-23 DIAGNOSIS — R262 Difficulty in walking, not elsewhere classified: Secondary | ICD-10-CM | POA: Diagnosis not present

## 2016-07-26 DIAGNOSIS — M25562 Pain in left knee: Secondary | ICD-10-CM | POA: Diagnosis not present

## 2016-07-26 DIAGNOSIS — R262 Difficulty in walking, not elsewhere classified: Secondary | ICD-10-CM | POA: Diagnosis not present

## 2016-07-26 DIAGNOSIS — Z96652 Presence of left artificial knee joint: Secondary | ICD-10-CM | POA: Diagnosis not present

## 2016-07-26 DIAGNOSIS — M25662 Stiffness of left knee, not elsewhere classified: Secondary | ICD-10-CM | POA: Diagnosis not present

## 2016-07-26 DIAGNOSIS — Z471 Aftercare following joint replacement surgery: Secondary | ICD-10-CM | POA: Diagnosis not present

## 2016-07-31 DIAGNOSIS — M25562 Pain in left knee: Secondary | ICD-10-CM | POA: Diagnosis not present

## 2016-07-31 DIAGNOSIS — M25662 Stiffness of left knee, not elsewhere classified: Secondary | ICD-10-CM | POA: Diagnosis not present

## 2016-07-31 DIAGNOSIS — R262 Difficulty in walking, not elsewhere classified: Secondary | ICD-10-CM | POA: Diagnosis not present

## 2016-08-02 DIAGNOSIS — M25562 Pain in left knee: Secondary | ICD-10-CM | POA: Diagnosis not present

## 2016-08-02 DIAGNOSIS — M25662 Stiffness of left knee, not elsewhere classified: Secondary | ICD-10-CM | POA: Diagnosis not present

## 2016-08-02 DIAGNOSIS — R262 Difficulty in walking, not elsewhere classified: Secondary | ICD-10-CM | POA: Diagnosis not present

## 2016-08-08 DIAGNOSIS — M25662 Stiffness of left knee, not elsewhere classified: Secondary | ICD-10-CM | POA: Diagnosis not present

## 2016-08-08 DIAGNOSIS — R262 Difficulty in walking, not elsewhere classified: Secondary | ICD-10-CM | POA: Diagnosis not present

## 2016-08-08 DIAGNOSIS — M25562 Pain in left knee: Secondary | ICD-10-CM | POA: Diagnosis not present

## 2016-08-13 ENCOUNTER — Encounter: Payer: Self-pay | Admitting: Family Medicine

## 2016-08-13 ENCOUNTER — Ambulatory Visit (INDEPENDENT_AMBULATORY_CARE_PROVIDER_SITE_OTHER): Payer: Medicare Other | Admitting: Family Medicine

## 2016-08-13 VITALS — BP 144/72 | HR 88 | Temp 97.5°F | Resp 16 | Wt 194.0 lb

## 2016-08-13 DIAGNOSIS — H9202 Otalgia, left ear: Secondary | ICD-10-CM

## 2016-08-13 DIAGNOSIS — M25562 Pain in left knee: Secondary | ICD-10-CM

## 2016-08-13 MED ORDER — HYDROCODONE-ACETAMINOPHEN 5-325 MG PO TABS: ORAL_TABLET | ORAL | 0 refills | Status: DC

## 2016-08-13 NOTE — Progress Notes (Addendum)
Subjective:     Patient ID: Tammy Hart, female   DOB: 05/11/1951, 65 y.o.   MRN: [id number]  HPI  Chief Complaint  Patient presents with  . Otalgia    Right ear. Pt is also concerned because she just had knee surgery, and is coming off of the Norco and Tizanidine that was prescribed for the pain. Pt reports she feels like she can't breathe, feels sweaty and jittery, notices heart palpitations. Pt wakes up about 8 times per night with this sensation. Was originally taking Norco 7.5-325 mg 2 tablets q 4 hours. Is now taking 1/2 tab  3 x day.  States she discontinued diazepam when placed on Tizanidine at bedtime. She is currently on no restrictions regarding her knee and walked a lot on it while in [place]. This exacerbated her pain and she took a couple of whole hydrocodone daily. Right ear is uncomfortable but not painful without change in hearing    Review of Systems     Objective:   Physical Exam  Constitutional: She appears well-developed and well-nourished. No distress.  HENT:  Right Ear: Tympanic membrane and ear canal normal.  Left Ear: Tympanic membrane and ear canal normal.  Skin:  Left knee incision is well healed       Assessment:    1. Left knee pain, unspecified chronicity: will need to taper her narocotic medication more slowly over a few weeks. - HYDROcodone-acetaminophen (NORCO/VICODIN) 5-325 MG tablet; One every 4-6 hours as needed for pain  Dispense: 28 tablet; Refill: 0  2. Otalgia of left ear: Monitor    Plan:    Continue Meloxicam. Go back to the dose of hydrocodone 7.5 mg. (on3- 4 x day) where her withdrawal symptoms go away. Resume diazepam at bedtime to help sleep and stop muscle relaxant. Start lower dose of hydrocodone in two days when 7.5 mg dose is exhausted.Discussed tapering 5 mg.weekly starting with highest daily dose needed to prevent withdrawal sx.

## 2016-08-13 NOTE — Patient Instructions (Addendum)
Stop Tizanidine and resume diazepam. Go back to the dose of Hydrocodone that made your jitters go away. I will prescribe a hydrocodone 5 mg. Dose to start when you run out of 7.5 mg. Phone f/u in one week on how you are doing on the lower dose of hydrocodone.

## 2016-08-16 DIAGNOSIS — M25662 Stiffness of left knee, not elsewhere classified: Secondary | ICD-10-CM | POA: Diagnosis not present

## 2016-08-16 DIAGNOSIS — R262 Difficulty in walking, not elsewhere classified: Secondary | ICD-10-CM | POA: Diagnosis not present

## 2016-08-16 DIAGNOSIS — M25562 Pain in left knee: Secondary | ICD-10-CM | POA: Diagnosis not present

## 2016-08-20 ENCOUNTER — Other Ambulatory Visit: Payer: Self-pay | Admitting: Family Medicine

## 2016-08-20 ENCOUNTER — Telehealth: Payer: Self-pay | Admitting: Family Medicine

## 2016-08-20 DIAGNOSIS — M25562 Pain in left knee: Secondary | ICD-10-CM

## 2016-08-20 MED ORDER — HYDROCODONE-ACETAMINOPHEN 5-325 MG PO TABS: ORAL_TABLET | ORAL | 0 refills | Status: DC

## 2016-08-20 NOTE — Telephone Encounter (Signed)
Discussed weekly taper by 1/2 pill daily. Rx refilled.

## 2016-08-20 NOTE — Telephone Encounter (Signed)
Pt was told to check in once a week.  Regarding her pan meds.  She is going to be needing a refill.  HYDROcodone-acetaminophen (NORCO/VICODIN) 5-325 MG tablet   08/13/16 -- [name], PA    One every 4-6 hours as needed for pain   She is doing good with this dosage but wants to know where you want to go from here.  Her call back is [phone].  Tammy Hart

## 2016-08-20 NOTE — Telephone Encounter (Signed)
Please review. KW 

## 2016-08-29 ENCOUNTER — Other Ambulatory Visit: Payer: Self-pay | Admitting: Family Medicine

## 2016-08-29 ENCOUNTER — Telehealth: Payer: Self-pay | Admitting: Family Medicine

## 2016-08-29 DIAGNOSIS — M25562 Pain in left knee: Secondary | ICD-10-CM

## 2016-08-29 MED ORDER — HYDROCODONE-ACETAMINOPHEN 5-325 MG PO TABS: ORAL_TABLET | ORAL | 0 refills | Status: DC

## 2016-08-29 NOTE — Telephone Encounter (Signed)
Patient states that she was calling you with her weekly update. Patient states that last time she spoke with you, you told her to cut back on Norco to 3.5 tablets over a 24hr period. Patient reports good compliance, she states that she has two pills left and wanted to know since she will be on this side of town this afternoon if you would refill prescription for her to pick up? Please advise. KW

## 2016-08-29 NOTE — Telephone Encounter (Signed)
Hydrocodone rx up front for pickup 

## 2016-08-29 NOTE — Telephone Encounter (Signed)
Pt is calling in to go over dosage amount of her   HYDROcodone-acetaminophen (NORCO/VICODIN) 5-325 MG tablet  Please call her back at [phone]  Thanks Barth Kirkseri

## 2016-08-29 NOTE — Telephone Encounter (Signed)
Patient advised.KW 

## 2016-09-05 ENCOUNTER — Other Ambulatory Visit: Payer: Self-pay | Admitting: Family Medicine

## 2016-09-05 DIAGNOSIS — M25562 Pain in left knee: Secondary | ICD-10-CM

## 2016-09-05 MED ORDER — HYDROCODONE-ACETAMINOPHEN 5-325 MG PO TABS: ORAL_TABLET | ORAL | 0 refills | Status: DC

## 2016-09-05 NOTE — Telephone Encounter (Signed)
Patient advised.

## 2016-09-05 NOTE — Telephone Encounter (Signed)
Pt contacted office for refill request on the following medications:  HYDROcodone-acetaminophen (NORCO/VICODIN) 5-325 MG tablet.  CB#[phone]/MW  Pt states he dosage should be reduced/MW

## 2016-09-05 NOTE — Telephone Encounter (Signed)
Please review. KW 

## 2016-09-05 NOTE — Telephone Encounter (Signed)
Hydrocodone rx up front for pickup 

## 2016-09-06 ENCOUNTER — Other Ambulatory Visit: Payer: Self-pay | Admitting: Family Medicine

## 2016-09-12 ENCOUNTER — Other Ambulatory Visit: Payer: Self-pay | Admitting: Family Medicine

## 2016-09-12 ENCOUNTER — Telehealth: Payer: Self-pay | Admitting: Family Medicine

## 2016-09-12 DIAGNOSIS — M25562 Pain in left knee: Secondary | ICD-10-CM

## 2016-09-12 MED ORDER — HYDROCODONE-ACETAMINOPHEN 5-325 MG PO TABS: ORAL_TABLET | ORAL | 0 refills | Status: DC

## 2016-09-12 NOTE — Telephone Encounter (Signed)
Hydrocodone rx available for pickup

## 2016-09-12 NOTE — Telephone Encounter (Signed)
Patient advised.KW 

## 2016-09-12 NOTE — Telephone Encounter (Signed)
Please review. KW 

## 2016-09-12 NOTE — Telephone Encounter (Signed)
Pt needs refill on her   HYDROcodone-acetaminophen (NORCO/VICODIN) 5-325 MG tablet   09/05/16 -- [name], PA    May take up to 2 1/2 pills daily as needed for pain     Please call when read.    ThanksTeri

## 2016-09-19 ENCOUNTER — Telehealth: Payer: Self-pay | Admitting: Family Medicine

## 2016-09-19 ENCOUNTER — Other Ambulatory Visit: Payer: Self-pay | Admitting: Family Medicine

## 2016-09-19 DIAGNOSIS — M25562 Pain in left knee: Secondary | ICD-10-CM

## 2016-09-19 MED ORDER — HYDROCODONE-ACETAMINOPHEN 5-325 MG PO TABS: ORAL_TABLET | ORAL | 0 refills | Status: DC

## 2016-09-19 NOTE — Telephone Encounter (Signed)
Hydrocodone rx up front for pickup 

## 2016-09-19 NOTE — Telephone Encounter (Signed)
For review. KW 

## 2016-09-19 NOTE — Telephone Encounter (Signed)
Patient has been advised. KW 

## 2016-09-19 NOTE — Telephone Encounter (Signed)
Pt needs refill on her HYDROcodone-acetaminophen (NORCO/VICODIN) 5-325 MG tablet ° ° ° °ThanksTeri °

## 2016-09-27 ENCOUNTER — Ambulatory Visit (INDEPENDENT_AMBULATORY_CARE_PROVIDER_SITE_OTHER): Payer: Medicare Other | Admitting: Family Medicine

## 2016-09-27 VITALS — BP 100/52 | HR 72 | Temp 97.7°F | Resp 16 | Wt 198.6 lb

## 2016-09-27 DIAGNOSIS — J039 Acute tonsillitis, unspecified: Secondary | ICD-10-CM | POA: Diagnosis not present

## 2016-09-27 LAB — POCT RAPID STREP A (OFFICE): Rapid Strep A Screen: NEGATIVE

## 2016-09-27 MED ORDER — AMOXICILLIN 875 MG PO TABS: 875.0000 mg | ORAL_TABLET | Freq: Two times a day (BID) | ORAL | 0 refills | Status: DC

## 2016-09-27 NOTE — Patient Instructions (Signed)
If you develop chest congestion and cough probably not strep but a viral upper respiratory infection. Use Delsym for cough and Mucinex for an expectorant along with your usual decongestant.

## 2016-09-27 NOTE — Progress Notes (Signed)
Subjective:     Patient ID: Tammy Hart, female   DOB: 11/02/1950, 65 y.o.   MRN: [id number]  HPI  Chief Complaint  Patient presents with  . Sore Throat    Patient comes in office today with complaints of sore throat and sinus pain and pressure below the eyes for the past 2 days. Patient states yesterday she ran a fever of 102.4, she states that her grandaughter had strep throat two weeks ago and was concerned that she might have been exposed . Patient reports fever last night high of 102.4 and congestion, patient took otc Claritin for relief.  Also reports she has completed slow taper of hydrocodone due to prior withdrawal symptoms and feels well.   Review of Systems     Objective:   Physical Exam  Constitutional: She appears well-developed and well-nourished. No distress.  Ears: T.M's intact without inflammation Throat: mildly enlarged erythematous tonsils with exudate bilaterally Neck: bilateral tender anterior cervical nodes. Lungs: clear     Assessment:    1. Tonsillitis: clinically appears to be strep - POCT rapid strep A - amoxicillin (AMOXIL) 875 MG tablet; Take 1 tablet (875 mg total) by mouth 2 (two) times daily.  Dispense: 20 tablet; Refill: 0    Plan:    Discussed otc medication if viral symptoms ensue.

## 2016-10-05 ENCOUNTER — Other Ambulatory Visit: Payer: Self-pay | Admitting: Family Medicine

## 2016-10-09 ENCOUNTER — Encounter: Payer: Self-pay | Admitting: Family Medicine

## 2016-10-09 ENCOUNTER — Ambulatory Visit (INDEPENDENT_AMBULATORY_CARE_PROVIDER_SITE_OTHER): Payer: Medicare Other | Admitting: Family Medicine

## 2016-10-09 ENCOUNTER — Ambulatory Visit
Admission: RE | Admit: 2016-10-09 | Discharge: 2016-10-09 | Disposition: A | Discharge status: Home / Self Care | Payer: Medicare Other | Source: Ambulatory Visit | Attending: Family Medicine | Admitting: Family Medicine

## 2016-10-09 ENCOUNTER — Telehealth: Payer: Self-pay

## 2016-10-09 VITALS — BP 118/60 | HR 87 | Temp 97.7°F | Resp 18 | Wt 196.6 lb

## 2016-10-09 DIAGNOSIS — B9789 Other viral agents as the cause of diseases classified elsewhere: Secondary | ICD-10-CM

## 2016-10-09 DIAGNOSIS — I700 Atherosclerosis of aorta: Secondary | ICD-10-CM | POA: Insufficient documentation

## 2016-10-09 DIAGNOSIS — Z87891 Personal history of nicotine dependence: Secondary | ICD-10-CM | POA: Diagnosis not present

## 2016-10-09 DIAGNOSIS — R05 Cough: Secondary | ICD-10-CM | POA: Diagnosis not present

## 2016-10-09 DIAGNOSIS — J069 Acute upper respiratory infection, unspecified: Secondary | ICD-10-CM

## 2016-10-09 DIAGNOSIS — R079 Chest pain, unspecified: Secondary | ICD-10-CM | POA: Diagnosis not present

## 2016-10-09 DIAGNOSIS — R0602 Shortness of breath: Secondary | ICD-10-CM | POA: Diagnosis not present

## 2016-10-09 MED ORDER — HYDROCODONE-HOMATROPINE 5-1.5 MG/5ML PO SYRP: ORAL_SOLUTION | ORAL | 0 refills | Status: DC

## 2016-10-09 MED ORDER — ALBUTEROL SULFATE HFA 108 (90 BASE) MCG/ACT IN AERS: 2.0000 | INHALATION_SPRAY | Freq: Four times a day (QID) | RESPIRATORY_TRACT | 0 refills | Status: DC | PRN

## 2016-10-09 NOTE — Telephone Encounter (Signed)
Pt advised. Tammy Hart, CMA  

## 2016-10-09 NOTE — Telephone Encounter (Signed)
-----   Message from [name], [place] sent at 10/09/2016 12:29 PM EST ----- No pneumonia

## 2016-10-09 NOTE — Progress Notes (Signed)
Subjective:     Patient ID: Tammy Hart, female   DOB: 12/14/1950, 66 y.o.   MRN: [id number]  HPI  Chief Complaint  Patient presents with  . Follow-up    Patient returns back to office for follow up visit,patient was last seen in office on 09/27/16 and diagnosed with tonsillitis and prescribed Amocil 875mg . Patient reports that she completed antibiotic and was feeling better. Had suddent onset yesterday of cough and chest pain from coughing, associated with cough paitent complains of shortness of breath, sinus drainage and sore throat.   No fever or other body aches. No flu shot this season. Has been using Mucinex Dm and Flonase for her sx.    Review of Systems     Objective:   Physical Exam  Constitutional: She appears well-developed and well-nourished. She appears distressed (mild pain with cough).  Ears: T.M's intact without inflammation Throat: no tonsillar enlargement or exudate Neck: no cervical adenopathy Lungs: clear     Assessment:    1. Viral upper respiratory tract infection - DG Chest 2 View; Future - HYDROcodone-homatropine (HYCODAN) 5-1.5 MG/5ML syrup; 5 ml 4-6 hours as needed for cough  Dispense: 240 mL; Refill: 0 - albuterol (PROVENTIL HFA;VENTOLIN HFA) 108 (90 Base) MCG/ACT inhaler; Inhale 2 puffs into the lungs every 6 (six) hours as needed for wheezing or shortness of breath.  Dispense: 1 Inhaler; Refill: 0    Plan:    Further f/u pending x-ray result.

## 2016-10-09 NOTE — Patient Instructions (Signed)
We will call you with the chest x-ray result.

## 2016-10-31 ENCOUNTER — Other Ambulatory Visit: Payer: Self-pay | Admitting: Family Medicine

## 2016-10-31 DIAGNOSIS — J069 Acute upper respiratory infection, unspecified: Secondary | ICD-10-CM

## 2016-11-01 ENCOUNTER — Other Ambulatory Visit: Payer: Self-pay | Admitting: Family Medicine

## 2016-11-08 ENCOUNTER — Other Ambulatory Visit: Payer: Self-pay | Admitting: Family Medicine

## 2016-11-08 NOTE — Telephone Encounter (Signed)
Called in. Emily Drozdowski, CMA  

## 2016-11-14 ENCOUNTER — Telehealth: Payer: Self-pay | Admitting: Family Medicine

## 2016-11-14 NOTE — Telephone Encounter (Signed)
Pt needs letter stating she is in good health to have her teeth cleaned and dental exam.  She said it has been around 15 years since she had her teeth cleaned.  The dental office requires a note.    She also needs an antibiotic to take the day of the exam.  Please call her when it is ready.  Thank sTeri

## 2016-11-14 NOTE — Telephone Encounter (Signed)
Please review. Thanks!  

## 2016-11-15 NOTE — Telephone Encounter (Signed)
Let her know letter is ready for pickup.

## 2016-11-15 NOTE — Telephone Encounter (Signed)
Patient has been advised. KW 

## 2016-11-15 NOTE — Telephone Encounter (Signed)
Patient called back to let Nadine CountsBob know Ortho wants her to have a antibiotic prior to dental work. They will send antibiotic to pharmacy. Patient only needs the general health from you.

## 2016-11-29 ENCOUNTER — Other Ambulatory Visit: Payer: Self-pay | Admitting: Family Medicine

## 2016-12-27 ENCOUNTER — Other Ambulatory Visit: Payer: Self-pay | Admitting: Family Medicine

## 2017-02-13 ENCOUNTER — Other Ambulatory Visit: Payer: Self-pay | Admitting: Family Medicine

## 2017-02-21 ENCOUNTER — Other Ambulatory Visit: Payer: Self-pay | Admitting: Family Medicine

## 2017-03-22 ENCOUNTER — Other Ambulatory Visit: Payer: Self-pay | Admitting: Family Medicine

## 2017-04-19 ENCOUNTER — Other Ambulatory Visit: Payer: Self-pay | Admitting: Family Medicine

## 2017-04-24 ENCOUNTER — Encounter: Payer: Self-pay | Admitting: Family Medicine

## 2017-04-24 ENCOUNTER — Ambulatory Visit (INDEPENDENT_AMBULATORY_CARE_PROVIDER_SITE_OTHER): Payer: Medicare Other | Admitting: Family Medicine

## 2017-04-24 ENCOUNTER — Other Ambulatory Visit: Payer: Self-pay | Admitting: Family Medicine

## 2017-04-24 VITALS — BP 136/86 | HR 75 | Temp 97.8°F | Resp 16 | Wt 211.2 lb

## 2017-04-24 DIAGNOSIS — W57XXXA Bitten or stung by nonvenomous insect and other nonvenomous arthropods, initial encounter: Secondary | ICD-10-CM | POA: Diagnosis not present

## 2017-04-24 DIAGNOSIS — R739 Hyperglycemia, unspecified: Secondary | ICD-10-CM

## 2017-04-24 LAB — POCT GLYCOSYLATED HEMOGLOBIN (HGB A1C): HEMOGLOBIN A1C: 6.5

## 2017-04-24 MED ORDER — FLUOCINONIDE 0.05 % EX CREA: 1.0000 "application " | TOPICAL_CREAM | Freq: Three times a day (TID) | CUTANEOUS | 0 refills | Status: DC

## 2017-04-24 NOTE — Patient Instructions (Signed)
Resume wise eating plan and try to walk 30 minutes daily. Recheck A1C in 3 months.

## 2017-04-24 NOTE — Progress Notes (Signed)
Subjective:     Patient ID: Tammy Hart, female   DOB: 02/18/1951, 66 y.o.   MRN: [id number]  HPI  Chief Complaint  Patient presents with  . Insect Bite    Patient comes in office today with complaints of multiple insect bites to her ankles. Patient states that she was walking out of rental property yesterday and was standing on a ant hill in front yard when she notied fire ants walking across her ankles. Patient states that hse has applied cortizone 10 cream, biofreeze, antiseptic wipes and has taken oral Benadryl with no relief. Patient describes skin as painful to the touch.   Also wishes to update her A1c as she has gained weight. Reports work is very busy and they have had a lot of family issues-wedding/funerals-and she has lapsed with her food choices.   Review of Systems     Objective:   Physical Exam  Constitutional: She appears well-developed and well-nourished. No distress.  Skin:  Several papules on the dorsum of her bilateral feet with mild inflammatory flare and pinpoint pustules.       Assessment:    1. Hyperglycemia - POCT glycosylated hemoglobin (Hb A1C)  2. Insect bite, initial encounter - fluocinonide cream (LIDEX) 0.05 %; Apply 1 application topically 3 (three) times daily.  Dispense: 15 g; Refill: 0    Plan:    Will continue oral antihistamines. Resume diet and exercise.

## 2017-05-08 ENCOUNTER — Other Ambulatory Visit: Payer: Self-pay | Admitting: Family Medicine

## 2017-05-08 MED ORDER — LORATADINE-PSEUDOEPHEDRINE ER 10-240 MG PO TB24: 1.0000 | ORAL_TABLET | Freq: Every day | ORAL | 1 refills | Status: DC

## 2017-06-10 ENCOUNTER — Other Ambulatory Visit: Payer: Self-pay | Admitting: Family Medicine

## 2017-07-18 ENCOUNTER — Other Ambulatory Visit: Payer: Self-pay | Admitting: Family Medicine

## 2017-09-06 ENCOUNTER — Encounter: Payer: Self-pay | Admitting: Family Medicine

## 2017-09-06 ENCOUNTER — Ambulatory Visit (INDEPENDENT_AMBULATORY_CARE_PROVIDER_SITE_OTHER): Payer: Medicare Other | Admitting: Family Medicine

## 2017-09-06 VITALS — BP 112/70 | HR 84 | Temp 98.9°F | Resp 16 | Wt 220.2 lb

## 2017-09-06 DIAGNOSIS — M25561 Pain in right knee: Secondary | ICD-10-CM | POA: Diagnosis not present

## 2017-09-06 DIAGNOSIS — G8929 Other chronic pain: Secondary | ICD-10-CM | POA: Diagnosis not present

## 2017-09-06 DIAGNOSIS — N3090 Cystitis, unspecified without hematuria: Secondary | ICD-10-CM

## 2017-09-06 LAB — POCT URINALYSIS DIPSTICK
GLUCOSE UA: NEGATIVE
Ketones, UA: NEGATIVE
NITRITE UA: POSITIVE
PH UA: 5 (ref 5.0–8.0)
Spec Grav, UA: <=1.005 — AB (ref 1.010–1.025)
UROBILINOGEN UA: 0.2 U/dL

## 2017-09-06 MED ORDER — CEPHALEXIN 500 MG PO CAPS: 500.0000 mg | ORAL_CAPSULE | Freq: Two times a day (BID) | ORAL | 0 refills | Status: DC

## 2017-09-06 NOTE — Patient Instructions (Signed)
We will call with the urine culture results.

## 2017-09-06 NOTE — Progress Notes (Signed)
Subjective:     Patient ID: Tammy Hart, female   DOB: 11/21/1950, 66 y.o.   MRN: [id number] Chief Complaint  Patient presents with  . Dysuria    Patient comes in office today with complaints of dysuria and frequency for the past 48hrs.  . Knee Pain    Patient reports right knee pain for teh past 2-3 months that has been radiating to her right hip. Patient reports difficulty when sitting, she has been taking otc Tylenol for pain.  Marland Kitchen. Headache    Patient complains of headache on the left side of her head for the past 24hrs  . Generalized Body Aches    Patient states that for the past several days she has had body ache and nausea, she reports a decrease in appetitie  . Hyperglycemia    Patient has concerns of elevated glucose she reports increased thirst for over 2 days.    HPI Has had prior left TKR. Concerned she may have overstressed the right knee and contributed to degenerative changes. Reports normal temperature at home.  Review of Systems     Objective:   Physical Exam  Constitutional: She appears well-developed and well-nourished. No distress.  Genitourinary:  Genitourinary Comments: No cva tenderness  Musculoskeletal:  Increased right knee pain with flexion> 90 degrees. Patellar crepitus with ranging. No effusion/ erythema. Mild tenderness medial aspect of her posterior knee.       Assessment:    1. Cystitis - cephALEXin (KEFLEX) 500 MG capsule; Take 1 capsule (500 mg total) by mouth 2 (two) times daily.  Dispense: 14 capsule; Refill: 0 - Urine Culture - POCT urinalysis dipstick  2. Chronic pain of right knee - DG Knee Complete 4 Views Right; Future    Plan:    Continue meloxicam/Tylenol. Further f/u in 2 weeks and pending culture results.

## 2017-09-09 LAB — URINE CULTURE
MICRO NUMBER:: 81380949
SPECIMEN QUALITY:: ADEQUATE

## 2017-09-10 ENCOUNTER — Telehealth: Payer: Self-pay

## 2017-09-10 NOTE — Telephone Encounter (Signed)
-----   Message from [name], [place] sent at 09/10/2017  1:06 PM EST ----- Continue cephalexin for an E. Coli infection

## 2017-09-10 NOTE — Telephone Encounter (Signed)
[id number] 

## 2017-09-10 NOTE — Telephone Encounter (Signed)
Patient advised.KW 

## 2017-09-19 ENCOUNTER — Telehealth: Payer: Self-pay | Admitting: Family Medicine

## 2017-09-19 NOTE — Telephone Encounter (Signed)
Error/MW °

## 2017-09-20 ENCOUNTER — Encounter: Payer: Self-pay | Admitting: Family Medicine

## 2017-09-20 ENCOUNTER — Ambulatory Visit (INDEPENDENT_AMBULATORY_CARE_PROVIDER_SITE_OTHER): Payer: Medicare Other | Admitting: Family Medicine

## 2017-09-20 VITALS — BP 140/86 | HR 78 | Temp 98.4°F | Resp 16 | Wt 222.4 lb

## 2017-09-20 DIAGNOSIS — R739 Hyperglycemia, unspecified: Secondary | ICD-10-CM | POA: Diagnosis not present

## 2017-09-20 DIAGNOSIS — N3000 Acute cystitis without hematuria: Secondary | ICD-10-CM | POA: Diagnosis not present

## 2017-09-20 LAB — POCT URINALYSIS DIPSTICK
BILIRUBIN UA: NEGATIVE
Blood, UA: NEGATIVE
GLUCOSE UA: NEGATIVE
Ketones, UA: NEGATIVE
Nitrite, UA: NEGATIVE
Protein, UA: NEGATIVE
Spec Grav, UA: <=1.005 — AB (ref 1.010–1.025)
Urobilinogen, UA: 0.2 E.U./dL
pH, UA: 7 (ref 5.0–8.0)

## 2017-09-20 LAB — POCT GLYCOSYLATED HEMOGLOBIN (HGB A1C): HEMOGLOBIN A1C: 6.3

## 2017-09-20 MED ORDER — CIPROFLOXACIN HCL 250 MG PO TABS: 250.0000 mg | ORAL_TABLET | Freq: Two times a day (BID) | ORAL | 0 refills | Status: DC

## 2017-09-20 NOTE — Patient Instructions (Signed)
We will call you with the urine culture results and x-ray if you choose to get it.

## 2017-09-20 NOTE — Progress Notes (Signed)
Subjective:     Patient ID: Tammy Hart, female   DOB: 06/19/1951, 66 y.o.   MRN: [id number] Chief Complaint  Patient presents with  . Knee Pain    Patient returns back to ofifce today for two week follow up , she has continued the Meloxicam and Tylenol. Patient reports that she still has pain and has not had a chance to get x-ray done.   . Urinary Tract Infection    Patient reports that urinary symptoms have not cleared from last visit she still has dysuria, frequency and pus seen in urine.    HPI Will also check A1C today due to hx of hyperglycemia and weight gain.  Review of Systems  Constitutional: Negative for chills and fever.       Objective:   Physical Exam  Constitutional: She appears well-developed and well-nourished. No distress.  Genitourinary:  Genitourinary Comments: No cva tenderness       Assessment:    1. Acute cystitis without hematuria - POCT urinalysis dipstick - Urine Culture  2. Hyperglycemia - POCT glycosylated hemoglobin (Hb A1C)    Plan:    Further f/u pending urine culture results and knee x-ray. She is aware of what seh needs to do to reduce her sugar.

## 2017-09-23 ENCOUNTER — Telehealth: Payer: Self-pay

## 2017-09-23 LAB — URINE CULTURE
MICRO NUMBER:: 81440313
SPECIMEN QUALITY: ADEQUATE

## 2017-09-23 NOTE — Telephone Encounter (Signed)
-----   Message from [name], [place] sent at 09/23/2017  7:37 AM EST ----- E. Coli on culture which is sensitive to Cipro.

## 2017-09-23 NOTE — Telephone Encounter (Signed)
Unable to reach patient at this time home/work number was busy will try and contact patient again at a later time. KW

## 2017-09-26 NOTE — Telephone Encounter (Signed)
Pt informed and voiced understanding of results. 

## 2017-10-11 ENCOUNTER — Ambulatory Visit
Admission: RE | Admit: 2017-10-11 | Discharge: 2017-10-11 | Disposition: A | Discharge status: Home / Self Care | Payer: Medicare Other | Source: Ambulatory Visit | Attending: Family Medicine | Admitting: Family Medicine

## 2017-10-11 DIAGNOSIS — M25561 Pain in right knee: Secondary | ICD-10-CM | POA: Diagnosis not present

## 2017-10-11 DIAGNOSIS — M1711 Unilateral primary osteoarthritis, right knee: Secondary | ICD-10-CM | POA: Diagnosis not present

## 2017-10-11 DIAGNOSIS — G8929 Other chronic pain: Secondary | ICD-10-CM

## 2017-10-11 DIAGNOSIS — M25461 Effusion, right knee: Secondary | ICD-10-CM | POA: Diagnosis not present

## 2017-10-14 ENCOUNTER — Other Ambulatory Visit: Payer: Self-pay | Admitting: Family Medicine

## 2017-10-14 ENCOUNTER — Telehealth: Payer: Self-pay

## 2017-10-14 DIAGNOSIS — M25561 Pain in right knee: Principal | ICD-10-CM

## 2017-10-14 DIAGNOSIS — G8929 Other chronic pain: Secondary | ICD-10-CM

## 2017-10-14 MED ORDER — TRAMADOL HCL 50 MG PO TABS: 50.0000 mg | ORAL_TABLET | Freq: Three times a day (TID) | ORAL | 0 refills | Status: DC | PRN

## 2017-10-14 NOTE — Telephone Encounter (Signed)
LMTCB-KW 

## 2017-10-14 NOTE — Telephone Encounter (Signed)
Contacted patient and advised as below, left message for pharmacy with new prescription. KW

## 2017-10-14 NOTE — Telephone Encounter (Signed)
-----   Message from [name], [place] sent at 10/12/2017  8:55 AM EST ----- Moderate to severe knee arthritis-Do you wish a referral to your orthopedic doctor?

## 2017-10-14 NOTE — Telephone Encounter (Signed)
Patient was advised she would like referral to [facility] to see Dr. Durene RomansMatthew Olin, patient also states that she has been taking otc Tylenol for pain and it is no longer helping her she request a prescription for Tramadol 50mg  until she is able to see Orthopedic. KW

## 2017-10-14 NOTE — Telephone Encounter (Signed)
Please call in tramadol as updated. Referral is in progress. Let her know to continue tylenol and meloxicam along with tramadol as needed.

## 2017-10-16 ENCOUNTER — Other Ambulatory Visit: Payer: Self-pay | Admitting: Family Medicine

## 2017-10-28 ENCOUNTER — Other Ambulatory Visit: Payer: Self-pay

## 2017-10-28 DIAGNOSIS — G8929 Other chronic pain: Secondary | ICD-10-CM

## 2017-10-28 DIAGNOSIS — M25561 Pain in right knee: Principal | ICD-10-CM

## 2017-10-28 MED ORDER — TRAMADOL HCL 50 MG PO TABS: 50.0000 mg | ORAL_TABLET | Freq: Three times a day (TID) | ORAL | 0 refills | Status: DC | PRN

## 2017-10-28 NOTE — Telephone Encounter (Signed)
Last filled 10/14/17, Im assuming patient is taking medication up to 2-3x a day since qty for prescription was 30. Please review chart and advise. KW

## 2017-10-28 NOTE — Telephone Encounter (Signed)
Prescription has been called into pharmacy, patient has been notified. KW

## 2017-10-28 NOTE — Telephone Encounter (Signed)
Patient is requesting a refill on Tramadol 50 mg. Pharmacy- [facility]  CB# [phone]

## 2017-10-28 NOTE — Telephone Encounter (Signed)
She see the orthopedic doctor on 2/8 so call in the 30 again

## 2017-11-08 DIAGNOSIS — M1711 Unilateral primary osteoarthritis, right knee: Secondary | ICD-10-CM | POA: Diagnosis not present

## 2017-11-08 DIAGNOSIS — M25561 Pain in right knee: Secondary | ICD-10-CM | POA: Diagnosis not present

## 2017-11-08 DIAGNOSIS — Z96652 Presence of left artificial knee joint: Secondary | ICD-10-CM | POA: Diagnosis not present

## 2017-11-12 ENCOUNTER — Other Ambulatory Visit: Payer: Self-pay | Admitting: Family Medicine

## 2017-11-12 DIAGNOSIS — G8929 Other chronic pain: Secondary | ICD-10-CM

## 2017-11-12 DIAGNOSIS — M25561 Pain in right knee: Principal | ICD-10-CM

## 2017-11-12 MED ORDER — TRAMADOL HCL 50 MG PO TABS: 50.0000 mg | ORAL_TABLET | Freq: Three times a day (TID) | ORAL | 2 refills | Status: DC | PRN

## 2017-11-12 NOTE — Telephone Encounter (Signed)
Last filled 10/28/17 please review. KW

## 2017-11-12 NOTE — Telephone Encounter (Signed)
Prescription has been called into [facility].KW 

## 2017-11-12 NOTE — Telephone Encounter (Signed)
Patient is requesting a refill on the following medication  traMADol (ULTRAM) 50 MG tablet   She uses [facility] in [place].

## 2017-11-12 NOTE — Telephone Encounter (Signed)
Please call in tramadol as updated in the EMR with refills. She will be getting knee replacement surgery in [place].

## 2017-11-19 ENCOUNTER — Ambulatory Visit (INDEPENDENT_AMBULATORY_CARE_PROVIDER_SITE_OTHER): Payer: Medicare Other | Admitting: Family Medicine

## 2017-11-19 ENCOUNTER — Encounter: Payer: Self-pay | Admitting: Family Medicine

## 2017-11-19 VITALS — BP 132/70 | HR 82 | Temp 97.7°F | Resp 18 | Wt 221.0 lb

## 2017-11-19 DIAGNOSIS — M7989 Other specified soft tissue disorders: Secondary | ICD-10-CM | POA: Diagnosis not present

## 2017-11-19 NOTE — Progress Notes (Signed)
Patient: [place] Female    DOB: 02/24/1951   67 y.o.   MRN: [id number] Visit Date: 11/19/2017  Today's Provider: Mila Merryonald Kaylla Cobos, MD   Chief Complaint  Patient presents with  . Knee Pain   Subjective:    Knee Pain   The incident occurred 12 to 24 hours ago. The pain is present in the right knee (behind her knee). Associated symptoms comments: Swelling behind the knee. She has tried ice (used a bag of frozen peas) for the symptoms. The treatment provided mild relief.  Patient is planning to have total right knee replacement surgery in [place]. She states that yesterday while bending over to pick something up, she felt pain behind her right knee. She states it feels like she pulled a muscle. Patient now has swelling and bruising in the back of her right knee.      Allergies  Allergen Reactions  . Cortisone Other (See Comments)    Causes elevated blood sugar and UTI  . Nitrofurantoin Monohyd Macro Nausea And Vomiting  . Sulfa Antibiotics Rash    Swelling, itching     Current Outpatient Medications:  .  diazepam (VALIUM) 5 MG tablet, TAKE 1/2 TO 1 TABLET BY MOUTH EVERY NIGHT AT BEDTIME AS NEEDED, Disp: 30 tablet, Rfl: 5 .  loratadine-pseudoephedrine (CLARITIN-D 24 HOUR) 10-240 MG 24 hr tablet, Take 1 tablet by mouth daily., Disp: 90 tablet, Rfl: 1 .  Loratadine-Pseudoephedrine (WAL-ITIN D 24 HOUR PO), TK 1 T PO D, Disp: , Rfl: 1 .  meloxicam (MOBIC) 15 MG tablet, TAKE 1 TABLET(15 MG) BY MOUTH DAILY WITH FOOD AS NEEDED FOR KNEE PAIN, Disp: 90 tablet, Rfl: 0 .  polyethylene glycol (MIRALAX / GLYCOLAX) packet, Take 17 g by mouth 2 (two) times daily., Disp: 14 each, Rfl: 0 .  traMADol (ULTRAM) 50 MG tablet, Take 1 tablet (50 mg total) by mouth every 8 (eight) hours as needed., Disp: 60 tablet, Rfl: 2 .  albuterol (PROVENTIL HFA;VENTOLIN HFA) 108 (90 Base) MCG/ACT inhaler, Inhale 2 puffs into the lungs every 6 (six) hours as needed for wheezing or shortness of breath. (Patient not  taking: Reported on 11/19/2017), Disp: 1 Inhaler, Rfl: 0 .  ciprofloxacin (CIPRO) 250 MG tablet, Take 1 tablet (250 mg total) by mouth 2 (two) times daily. (Patient not taking: Reported on 11/19/2017), Disp: 14 tablet, Rfl: 0  Review of Systems  Constitutional: Negative for appetite change, chills, fatigue and fever.  Respiratory: Negative for chest tightness and shortness of breath.   Cardiovascular: Negative for chest pain and palpitations.  Gastrointestinal: Negative for abdominal pain, nausea and vomiting.  Neurological: Negative for dizziness and weakness.    Social History   Tobacco Use  . Smoking status: Former Smoker    Last attempt to quit: 10/01/2006    Years since quitting: 11.1  . Smokeless tobacco: Never Used  Substance Use Topics  . Alcohol use: Yes    Comment: 2 times a year   Objective:   BP 132/70 (BP Location: Left Arm, Patient Position: Sitting, Cuff Size: Large)   Pulse 82   Temp 97.7 F (36.5 C) (Oral)   Resp 18   Wt 221 lb (100.2 kg)   SpO2 98% Comment: room air  BMI 35.67 kg/m  There were no vitals filed for this visit.   Physical Exam  General appearance: alert, well developed, well nourished, cooperative and in no distress Head: Normocephalic, without obvious abnormality, atraumatic Respiratory: Respirations even and unlabored,  normal respiratory rate Extremities: discrete palpable mass right popliteal fossae, slightly tender to touch. No erythema, but moderate varicosities and faint violet discoloration. .     Assessment & Plan:     1. Right leg swelling Soft tissue swelling versus Baker's Cyst versus DVT  - US Venous Img Lower Unilateral Right; Future       Mila Merry, MD  [facility]

## 2017-11-20 ENCOUNTER — Ambulatory Visit
Admission: RE | Admit: 2017-11-20 | Discharge: 2017-11-20 | Disposition: A | Discharge status: Home / Self Care | Payer: Medicare Other | Source: Ambulatory Visit | Attending: Family Medicine | Admitting: Family Medicine

## 2017-11-20 DIAGNOSIS — M7121 Synovial cyst of popliteal space [Baker], right knee: Secondary | ICD-10-CM | POA: Diagnosis not present

## 2017-11-20 DIAGNOSIS — M7989 Other specified soft tissue disorders: Secondary | ICD-10-CM | POA: Diagnosis not present

## 2017-11-20 DIAGNOSIS — M79604 Pain in right leg: Secondary | ICD-10-CM | POA: Insufficient documentation

## 2017-11-21 ENCOUNTER — Telehealth: Payer: Self-pay

## 2017-11-21 NOTE — Telephone Encounter (Signed)
Advised patient as below. Patient reports that she is due to have knee surgery in [place], and wanted to know if this will conflict with her surgery. She wanted me to [place] [place] [facility] [facility] [place] to see if this would be an issue.   I left message for them to contact the patient to see if she will need to reschedule her surgery.

## 2017-11-21 NOTE — Telephone Encounter (Signed)
-----   Message from Malva Limesonald E Fisher, MD sent at 11/20/2017  5:11 PM EST ----- Ultrasound shows Baker's Cyst which is an inflammatory cyst usually caused by arthritis in the knee joint. Usually resolve on its own after a few months. No blood clots. If cyst bothers her too much we can refer to orthopedics, sometimes they can drain it.

## 2017-11-25 ENCOUNTER — Other Ambulatory Visit: Payer: Self-pay | Admitting: Family Medicine

## 2017-11-28 ENCOUNTER — Ambulatory Visit: Payer: Self-pay | Admitting: Physician Assistant

## 2017-12-09 ENCOUNTER — Other Ambulatory Visit: Payer: Self-pay | Admitting: Family Medicine

## 2017-12-30 ENCOUNTER — Encounter: Payer: Self-pay | Admitting: Family Medicine

## 2017-12-30 ENCOUNTER — Ambulatory Visit (INDEPENDENT_AMBULATORY_CARE_PROVIDER_SITE_OTHER): Payer: Medicare Other | Admitting: Family Medicine

## 2017-12-30 VITALS — BP 144/100 | HR 72 | Temp 97.8°F | Resp 15 | Wt 220.0 lb

## 2017-12-30 DIAGNOSIS — S39012A Strain of muscle, fascia and tendon of lower back, initial encounter: Secondary | ICD-10-CM | POA: Diagnosis not present

## 2017-12-30 MED ORDER — KETOROLAC TROMETHAMINE 60 MG/2ML IM SOLN
60.0000 mg | Freq: Once | INTRAMUSCULAR | Status: AC
  Administered 2017-12-30: 60 mg via INTRAMUSCULAR

## 2017-12-30 NOTE — Progress Notes (Addendum)
Subjective:     Patient ID: [place]my [place], female   DOB: 12/01/1950, 67 y.o.   MRN: [id number] Chief Complaint  Patient presents with  . Back Pain    Patient comes in office today with complaints of bvack pain. Patient states this morning upon awakening she felt fine but when she got in bathtub to soak and get out she felt like she had a spasm/cramp in her back. Patient has tried otc Tylenol and prescripton Tramadol and Tizidane 4mg . Patient reports difficulty standing and decreased range of motion.    HPI Denies radiation of pain or prior back surgery. Accompanied by her husband today.  Review of Systems     Objective:   Physical Exam  Constitutional: She appears well-developed and well-nourished. She appears distressed (moderate pain when changing positions).  Musculoskeletal:  Muscle strength in lower extremities 5/5. SLR's to 80- 90 degrees without radiation of back pain.Localizes tenderness to her left paravertebral lumbar area. Mild tenderness to the touch.       Assessment:    1. Low back strain, initial encounter - ketorolac (TORADOL) injection 60 mg    Plan:    Continue current medications. Discussed use of cold compresses for 48 hours then heat. Discussed red flag signs with them.

## 2017-12-30 NOTE — Patient Instructions (Signed)
Use cold compresses for first 48 hours for 20 minutes several x day then switch to heat. May continue meloxicam, tramadol and tizanidine.

## 2018-01-14 ENCOUNTER — Other Ambulatory Visit: Payer: Self-pay | Admitting: Family Medicine

## 2018-02-06 ENCOUNTER — Other Ambulatory Visit: Payer: Self-pay | Admitting: Family Medicine

## 2018-02-06 DIAGNOSIS — M25561 Pain in right knee: Principal | ICD-10-CM

## 2018-02-06 DIAGNOSIS — G8929 Other chronic pain: Secondary | ICD-10-CM

## 2018-03-04 ENCOUNTER — Encounter: Payer: Self-pay | Admitting: Family Medicine

## 2018-03-04 ENCOUNTER — Ambulatory Visit (INDEPENDENT_AMBULATORY_CARE_PROVIDER_SITE_OTHER): Payer: Medicare Other | Admitting: Family Medicine

## 2018-03-04 VITALS — BP 124/84 | HR 79 | Temp 97.7°F | Resp 16 | Ht 64.0 in | Wt 223.2 lb

## 2018-03-04 DIAGNOSIS — R739 Hyperglycemia, unspecified: Secondary | ICD-10-CM

## 2018-03-04 DIAGNOSIS — Z01818 Encounter for other preprocedural examination: Secondary | ICD-10-CM

## 2018-03-04 LAB — POCT GLYCOSYLATED HEMOGLOBIN (HGB A1C): HEMOGLOBIN A1C: 6.2 % — AB (ref 4.0–5.6)

## 2018-03-04 NOTE — Patient Instructions (Addendum)
I will send in the form when I get the labs back tomorrow.

## 2018-03-04 NOTE — Progress Notes (Signed)
  Subjective:     Patient ID: [place]my Hart, female   DOB: 04/21/1951, 67 y.o.   MRN: [id number] Chief Complaint  Patient presents with  . Pre-op Exam    Patient comes into office today for pre-op examination, patient states that she feels well today and has no concerns to address. Patient is scheduled for a total right knee arthroplasty on 03/25/18.    HPI   Review of Systems  Respiratory: Negative for shortness of breath.   Cardiovascular: Negative for chest pain and palpitations.  Genitourinary: Negative for dysuria.  Neurological: Negative for dizziness.       Objective:   Physical Exam  Constitutional: She appears well-developed and well-nourished. No distress.  Cardiovascular: Normal rate and regular rhythm.  Pulmonary/Chest: Breath sounds normal.  Musculoskeletal: She exhibits no edema (pedal).       Assessment:    1. Hyperglycemia: remains in pre-diabetic range - POCT glycosylated hemoglobin (Hb A1C)  2. Pre-op testing - EKG 12-Lead - Comprehensive metabolic panel - CBC with Differential/Platelet    Plan:    We will call with the lab results tomorrow and send in your form at that time if labs are normal.

## 2018-03-05 ENCOUNTER — Telehealth: Payer: Self-pay

## 2018-03-05 LAB — COMPREHENSIVE METABOLIC PANEL
ALT: 18 IU/L (ref 0–32)
AST: 22 IU/L (ref 0–40)
Albumin/Globulin Ratio: 1.5 (ref 1.2–2.2)
Albumin: 4.6 g/dL (ref 3.6–4.8)
Alkaline Phosphatase: 96 IU/L (ref 39–117)
BUN/Creatinine Ratio: 23 (ref 12–28)
BUN: 17 mg/dL (ref 8–27)
Bilirubin Total: 0.2 mg/dL (ref 0.0–1.2)
CALCIUM: 10 mg/dL (ref 8.7–10.3)
CO2: 24 mmol/L (ref 20–29)
Chloride: 100 mmol/L (ref 96–106)
Creatinine, Ser: 0.74 mg/dL (ref 0.57–1.00)
GFR, EST AFRICAN AMERICAN: 96 mL/min/{1.73_m2} (ref >59)
GFR, EST NON AFRICAN AMERICAN: 83 mL/min/{1.73_m2} (ref >59)
GLUCOSE: 96 mg/dL (ref 65–99)
Globulin, Total: 3 g/dL (ref 1.5–4.5)
Potassium: 5 mmol/L (ref 3.5–5.2)
Sodium: 140 mmol/L (ref 134–144)
TOTAL PROTEIN: 7.6 g/dL (ref 6.0–8.5)

## 2018-03-05 LAB — CBC WITH DIFFERENTIAL/PLATELET
BASOS ABS: 0 10*3/uL (ref 0.0–0.2)
BASOS: 0 %
EOS (ABSOLUTE): 0.2 10*3/uL (ref 0.0–0.4)
Eos: 2 %
Hematocrit: 39.7 % (ref 34.0–46.6)
Hemoglobin: 12.8 g/dL (ref 11.1–15.9)
Immature Grans (Abs): 0 10*3/uL (ref 0.0–0.1)
Immature Granulocytes: 0 %
LYMPHS: 26 %
Lymphocytes Absolute: 2.9 10*3/uL (ref 0.7–3.1)
MCH: 29.5 pg (ref 26.6–33.0)
MCHC: 32.2 g/dL (ref 31.5–35.7)
MCV: 92 fL (ref 79–97)
MONOCYTES: 7 %
Monocytes Absolute: 0.7 10*3/uL (ref 0.1–0.9)
NEUTROS ABS: 7.1 10*3/uL — AB (ref 1.4–7.0)
NEUTROS PCT: 65 %
Platelets: 321 10*3/uL (ref 150–450)
RBC: 4.34 x10E6/uL (ref 3.77–5.28)
RDW: 14.5 % (ref 12.3–15.4)
WBC: 10.9 10*3/uL — ABNORMAL HIGH (ref 3.4–10.8)

## 2018-03-05 NOTE — Telephone Encounter (Signed)
-----   Message from [name], [place] sent at 03/05/2018  7:22 AM EDT ----- Labs ok except for a tiny increase in your white count-Any sign of infection anywhere? I will send in your form today.

## 2018-03-05 NOTE — Telephone Encounter (Signed)
[id number] 

## 2018-03-05 NOTE — Telephone Encounter (Signed)
Yes, let's do that. Does not need an office visit for just a u/a. This will be part of her pre-op testing we did yesterday.

## 2018-03-05 NOTE — Telephone Encounter (Signed)
Patient has been advised she states that she has no signs of infection but said if you want her to drop of a urine sample she can. KW

## 2018-03-05 NOTE — Telephone Encounter (Signed)
Pt returned call ° °teri °

## 2018-03-06 NOTE — Telephone Encounter (Signed)
Patient states that she is busy today but will come in morning to leave urine sample to be tested. KW

## 2018-03-07 ENCOUNTER — Other Ambulatory Visit: Payer: Self-pay | Admitting: Family Medicine

## 2018-03-07 DIAGNOSIS — N3000 Acute cystitis without hematuria: Secondary | ICD-10-CM | POA: Diagnosis not present

## 2018-03-07 LAB — POCT URINALYSIS DIPSTICK
GLUCOSE UA: NEGATIVE
Nitrite, UA: POSITIVE
Protein, UA: POSITIVE — AB
Spec Grav, UA: 1.01 (ref 1.010–1.025)
Urobilinogen, UA: 0.2 E.U./dL
pH, UA: 5 (ref 5.0–8.0)

## 2018-03-07 NOTE — Progress Notes (Unsigned)
Patient walked in office today to drop of urine sample, please review over results. KW  Urinalysis    Component Value Date/Time   BILIRUBINUR small 03/07/2018 1134   PROTEINUR Positive (A) 03/07/2018 1134   UROBILINOGEN 0.2 03/07/2018 1134   NITRITE positive 03/07/2018 1134   LEUKOCYTESUR Large (3+) (A) 03/07/2018 1134

## 2018-03-10 ENCOUNTER — Telehealth: Payer: Self-pay

## 2018-03-10 ENCOUNTER — Other Ambulatory Visit: Payer: Self-pay | Admitting: Family Medicine

## 2018-03-10 ENCOUNTER — Other Ambulatory Visit: Payer: Self-pay | Admitting: Orthopedic Surgery

## 2018-03-10 LAB — URINE CULTURE

## 2018-03-10 MED ORDER — CEPHALEXIN 500 MG PO CAPS: 500.0000 mg | ORAL_CAPSULE | Freq: Two times a day (BID) | ORAL | 0 refills | Status: DC

## 2018-03-10 NOTE — Care Plan (Signed)
Ortho Bundle R TKA scheduled 03-25-18 DCP:  Home with spouse.  1 story home with 2 ste. DME:  No needs.  Has a RW and toilet has a grab bar. [place]  [facility] [place].  PT eval scheduled on 03-28-18 at 10:00 am.

## 2018-03-10 NOTE — Telephone Encounter (Signed)
-----   Message from [name], [place] sent at 03/10/2018  7:30 AM EDT ----- I have sent in an antibiotic for an E. Coli urinary tract infection.

## 2018-03-10 NOTE — H&P (Signed)
TOTAL KNEE ADMISSION H&P  Patient is being admitted for right total knee arthroplasty.  Subjective:  Chief Complaint:  Right knee primary OA / pain  HPI: [place], 67 y.o. female, has a history of pain and functional disability in the right knee due to arthritis and has failed non-surgical conservative treatments for greater than 12 weeks to include NSAID's and/or analgesics and activity modification.  Onset of symptoms was gradual, starting ~2 years ago with gradually worsening course since that time. The patient noted prior procedures on the knee to include  arthroplasty on the left knee in Sept 2017 per Dr. Charlann Boxerlin .  Patient currently rates pain in the right knee(s) at 8 out of 10 with activity. Patient has worsening of pain with activity and weight bearing, pain that interferes with activities of daily living, pain with passive range of motion, crepitus and joint swelling.  Patient has evidence of periarticular osteophytes and joint space narrowing by imaging studies.  There is no active infection.  Risks, benefits and expectations were discussed with the patient.  Risks including but not limited to the risk of anesthesia, blood clots, nerve damage, blood vessel damage, failure of the prosthesis, infection and up to and including death.  Patient understand the risks, benefits and expectations and wishes to proceed with surgery.   PCP: Anola Gurneyhauvin, Robert, PA  D/C Plans:       Home  Post-op Meds:       No Rx given   Tranexamic Acid:      To be given - IV   Decadron:      Is to be given  FYI:      ASA  Norco  DME:   Pt already has equipment  PT:   OPPT setup already   Patient Active Problem List   Diagnosis Date Noted  . Obese 06/13/2016  . 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 Hyperglycemia 11/09/2015  . H/O headache 08/08/2015  . Allergic rhinitis 08/08/2015   Past Medical History:  Diagnosis Date  . Allergy   . Arthritis    neck (mainly), other joints mildly  . Degenerative joint  disease of left knee   . Pre-diabetes     Past Surgical History:  Procedure Laterality Date  . ABDOMINAL HYSTERECTOMY    . CATARACT EXTRACTION W/PHACO Right 05/11/2015   Procedure: CATARACT EXTRACTION PHACO AND INTRAOCULAR LENS PLACEMENT (IOC);  Surgeon: Lockie Molahadwick Brasington, MD;  [facility];  Service: Ophthalmology;  Laterality: Right;  PER DR OFFICE(ELLEN) PLEASE KEEP PT ARRIVAL TIME 9-9:30  . RHINOPLASTY    . TOTAL KNEE ARTHROPLASTY Left 06/12/2016   Procedure: LEFT TOTAL KNEE ARTHROPLASTY;  Surgeon: Durene RomansMatthew Olin, MD;  Location: [facility];  Service: Orthopedics;  Laterality: Left;    No current facility-administered medications for this encounter.    Current Outpatient Medications  Medication Sig Dispense Refill Last Dose  . cephALEXin (KEFLEX) 500 MG capsule Take 1 capsule (500 mg total) by mouth 2 (two) times daily. 14 capsule 0   . diazepam (VALIUM) 5 MG tablet TAKE 1/2 TO 1 TABLET BY MOUTH AT BEDTIME AS NEEDED 30 tablet 5 Taking  . meloxicam (MOBIC) 15 MG tablet TAKE 1 TABLET(15 MG) BY MOUTH DAILY WITH FOOD AS NEEDED FOR KNEE PAIN 90 tablet 0 Taking  . polyethylene glycol (MIRALAX / GLYCOLAX) packet Take 17 g by mouth 2 (two) times daily. 14 each 0 Taking  . traMADol (ULTRAM) 50 MG tablet TAKE 1 TABLET BY MOUTH EVERY 8 HOURS AS  NEEDED 60 tablet 2 Taking  . WAL-ITIN D 24 HOUR 10-240 MG 24 hr tablet TAKE 1 TABLET BY MOUTH DAILY 90 tablet 1 Taking   Allergies  Allergen Reactions  . Cortisone Other (See Comments)    Causes elevated blood sugar and UTI  . Nitrofurantoin Monohyd Macro Nausea And Vomiting  . Sulfa Antibiotics Rash    Swelling, itching    Social History   Tobacco Use  . Smoking status: Former Smoker    Last attempt to quit: 10/01/2006    Years since quitting: 11.4  . Smokeless tobacco: Never Used  Substance Use Topics  . Alcohol use: Yes    Comment: 2 times a year    Family History  Problem Relation Age of Onset  . Stroke Mother   . Congestive  Heart Failure Father   . Hypertension Son   . Stroke Maternal Grandfather   . Congestive Heart Failure Paternal Grandmother   . Congestive Heart Failure Paternal Grandfather      Review of Systems  Constitutional: Negative.   HENT: Negative.   Eyes: Negative.   Respiratory: Negative.   Cardiovascular: Negative.   Gastrointestinal: Negative.   Genitourinary: Negative.   Musculoskeletal: Positive for joint pain.  Skin: Negative.   Neurological: Positive for headaches.  Endo/Heme/Allergies: Positive for environmental allergies.  Psychiatric/Behavioral: Negative.     Objective:  Physical Exam  Constitutional: She is oriented to person, place, and time. She appears well-developed.  HENT:  Head: Normocephalic.  Eyes: Pupils are equal, round, and reactive to light.  Neck: Neck supple. No JVD present. No tracheal deviation present. No thyromegaly present.  Cardiovascular: Normal rate, regular rhythm and intact distal pulses.  Respiratory: Effort normal and breath sounds normal. No respiratory distress. She has no wheezes.  GI: Soft. There is no tenderness. There is no guarding.  Musculoskeletal:       Right knee: She exhibits decreased range of motion, swelling and bony tenderness. She exhibits no ecchymosis, no deformity, no laceration and no erythema. Tenderness found.  Lymphadenopathy:    She has no cervical adenopathy.  Neurological: She is alert and oriented to person, place, and time.  Skin: Skin is warm and dry.  Psychiatric: She has a normal mood and affect.      Labs:  Estimated body mass index is 38.31 kg/m as calculated from the following:   Height as of 03/04/18: 5\' 4"  (1.626 m).   Weight as of 03/04/18: 101.2 kg (223 lb 3.2 oz).   Imaging Review Plain radiographs demonstrate severe degenerative joint disease of the right knee(s).  The bone quality appears to be good for age and reported activity level.   Preoperative templating of the joint replacement has  been completed, documented, and submitted to the Operating Room personnel in order to optimize intra-operative equipment management.   Anticipated LOS equal to or greater than 2 midnights due to - Age 23 and older with one or more of the following:  - Obesity  - Expected need for hospital services (PT, OT, Nursing) required for safe  discharge     Assessment/Plan:  End stage arthritis, right knee   The patient history, physical examination, clinical judgment of the provider and imaging studies are consistent with end stage degenerative joint disease of the right knee and total knee arthroplasty is deemed medically necessary. The treatment options including medical management, injection therapy arthroscopy and arthroplasty were discussed at length. The risks and benefits of total knee arthroplasty were presented and reviewed. The risks  due to aseptic loosening, infection, stiffness, patella tracking problems, thromboembolic complications and other imponderables were discussed. The patient acknowledged the explanation, agreed to proceed with the plan and consent was signed. Patient is being admitted for inpatient treatment for surgery, pain control, PT, OT, prophylactic antibiotics, VTE prophylaxis, progressive ambulation and ADL's and discharge planning. The patient is planning to be discharged home.     Anastasio Auerbach Zabdiel Dripps   PA-C  03/10/2018, 12:45 PM

## 2018-03-10 NOTE — Telephone Encounter (Signed)
Pt advised.   Thanks,   -Brianni Manthe  

## 2018-03-18 NOTE — Progress Notes (Signed)
03-04-18 (Epic) EKG, CMP, CBC w/Diff

## 2018-03-18 NOTE — Patient Instructions (Signed)
Tammy Hart Geno  03/18/2018   Your procedure is scheduled on: 03-25-18   [facility]    Report to Admitting at 9:55 AM    Call this number if you have problems the morning of surgery [phone]   Remember: Do not eat food or drink liquids :After Midnight.     Take these medicines the morning of surgery with A SIP OF WATER: Claritin D                                You may not have any metal on your body including hair pins and              piercings  Do not wear jewelry, make-up, lotions, powders or perfumes, deodorant             Do not wear nail polish.  Do not shave  48 hours prior to surgery.                 Do not bring valuables to the hospital. [place] IS NOT             RESPONSIBLE   FOR VALUABLES.  Contacts, dentures or bridgework may not be worn into surgery.  Leave suitcase in the car. After surgery it may be brought to your room.   Special Instructions: N/A              Please read over the following fact sheets you were given: _____________________________________________________________________             [facility] Pre[facility] Before surgery, you can play an important role.  Because skin is not sterile, your skin needs to be as free of germs as possible.  You can reduce the number of germs on your skin by washing with CHG (chlorahexidine gluconate) soap before surgery.  CHG is an antiseptic cleaner which kills germs and bonds with the skin to continue killing germs even after washing. Please DO NOT use if you have an allergy to CHG or antibacterial soaps.  If your skin becomes reddened/irritated stop using the CHG and inform your nurse when you arrive at Short Stay. Do not shave (including legs and underarms) for at least 48 hours prior to the first CHG shower.  You may shave your face/neck. Please follow these instructions carefully:  1.  Shower with CHG Soap the night before surgery and the   morning of Surgery.  2.  If you choose to wash your hair, wash your hair first as usual with your  normal  shampoo.  3.  After you shampoo, rinse your hair and body thoroughly to remove the  shampoo.                           4.  Use CHG as you would any other liquid soap.  You can apply chg directly  to the skin and wash                       Gently with a scrungie or clean washcloth.  5.  Apply the CHG Soap to your body ONLY FROM THE NECK DOWN.   Do not use on face/ open  Wound or open sores. Avoid contact with eyes, ears mouth and genitals (private parts).                       Wash face,  Genitals (private parts) with your normal soap.             6.  Wash thoroughly, paying special attention to the area where your surgery  will be performed.  7.  Thoroughly rinse your body with warm water from the neck down.  8.  DO NOT shower/wash with your normal soap after using and rinsing off  the CHG Soap.                9.  Pat yourself dry with a clean towel.            10.  Wear clean pajamas.            11.  Place clean sheets on your bed the night of your first shower and do not  sleep with pets. Day of Surgery : Do not apply any lotions/deodorants the morning of surgery.  Please wear clean clothes to the hospital/surgery center.  FAILURE TO FOLLOW THESE INSTRUCTIONS MAY RESULT IN THE CANCELLATION OF YOUR SURGERY PATIENT SIGNATURE_________________________________  NURSE SIGNATURE__________________________________  ________________________________________________________________________   Adam Phenix  An incentive spirometer is a tool that can help keep your lungs clear and active. This tool measures how well you are filling your lungs with each breath. Taking long deep breaths may help reverse or decrease the chance of developing breathing (pulmonary) problems (especially infection) following:  A long period of time when you are unable to move or be  active. BEFORE THE PROCEDURE   If the spirometer includes an indicator to show your best effort, your nurse or respiratory therapist will set it to a desired goal.  If possible, sit up straight or lean slightly forward. Try not to slouch.  Hold the incentive spirometer in an upright position. INSTRUCTIONS FOR USE  1. Sit on the edge of your bed if possible, or sit up as far as you can in bed or on a chair. 2. Hold the incentive spirometer in an upright position. 3. Breathe out normally. 4. Place the mouthpiece in your mouth and seal your lips tightly around it. 5. Breathe in slowly and as deeply as possible, raising the piston or the ball toward the top of the column. 6. Hold your breath for 3-5 seconds or for as long as possible. Allow the piston or ball to fall to the bottom of the column. 7. Remove the mouthpiece from your mouth and breathe out normally. 8. Rest for a few seconds and repeat Steps 1 through 7 at least 10 times every 1-2 hours when you are awake. Take your time and take a few normal breaths between deep breaths. 9. The spirometer may include an indicator to show your best effort. Use the indicator as a goal to work toward during each repetition. 10. After each set of 10 deep breaths, practice coughing to be sure your lungs are clear. If you have an incision (the cut made at the time of surgery), support your incision when coughing by placing a pillow or rolled up towels firmly against it. Once you are able to get out of bed, walk around indoors and cough well. You may stop using the incentive spirometer when instructed by your caregiver.  RISKS AND COMPLICATIONS  Take your time so you do not get  dizzy or light-headed.  If you are in pain, you may need to take or ask for pain medication before doing incentive spirometry. It is harder to take a deep breath if you are having pain. AFTER USE  Rest and breathe slowly and easily.  It can be helpful to keep track of a log of  your progress. Your caregiver can provide you with a simple table to help with this. If you are using the spirometer at home, follow these instructions: [place] IF:   You are having difficultly using the spirometer.  You have trouble using the spirometer as often as instructed.  Your pain medication is not giving enough relief while using the spirometer.  You develop fever of 100.5 F (38.1 C) or higher. SEEK IMMEDIATE MEDICAL CARE IF:   You cough up bloody sputum that had not been present before.  You develop fever of 102 F (38.9 C) or greater.  You develop worsening pain at or near the incision site. MAKE SURE YOU:   Understand these instructions.  Will watch your condition.  Will get help right away if you are not doing well or get worse. Document Released: 01/28/2007 Document Revised: 12/10/2011 Document Reviewed: 03/31/2007 Exit[employer] Patient Information 2014 Exit[employer], [place].   ________________________________________________________________________  WHAT IS A BLOOD TRANSFUSION? Blood Transfusion Information  A transfusion is the replacement of blood or some of its parts. Blood is made up of multiple cells which provide different functions.  Red blood cells carry oxygen and are used for blood loss replacement.  White blood cells fight against infection.  Platelets control bleeding.  Plasma helps clot blood.  Other blood products are available for specialized needs, such as hemophilia or other clotting disorders. BEFORE THE TRANSFUSION  Who gives blood for transfusions?   Healthy volunteers who are fully evaluated to make sure their blood is safe. This is blood bank blood. Transfusion therapy is the safest it has ever been in the practice of medicine. Before blood is taken from a donor, a complete history is taken to make sure that person has no history of diseases nor engages in risky social behavior (examples are intravenous drug use or sexual activity  with multiple partners). The donor's travel history is screened to minimize risk of transmitting infections, such as malaria. The donated blood is tested for signs of infectious diseases, such as HIV and hepatitis. The blood is then tested to be sure it is compatible with you in order to minimize the chance of a transfusion reaction. If you or a relative donates blood, this is often done in anticipation of surgery and is not appropriate for emergency situations. It takes many days to process the donated blood. RISKS AND COMPLICATIONS Although transfusion therapy is very safe and saves many lives, the main dangers of transfusion include:   Getting an infectious disease.  Developing a transfusion reaction. This is an allergic reaction to something in the blood you were given. Every precaution is taken to prevent this. The decision to have a blood transfusion has been considered carefully by your caregiver before blood is given. Blood is not given unless the benefits outweigh the risks. AFTER THE TRANSFUSION  Right after receiving a blood transfusion, you will usually feel much better and more energetic. This is especially true if your red blood cells have gotten low (anemic). The transfusion raises the level of the red blood cells which carry oxygen, and this usually causes an energy increase.  The nurse administering the transfusion will  monitor you carefully for complications. HOME CARE INSTRUCTIONS  No special instructions are needed after a transfusion. You may find your energy is better. Speak with your caregiver about any limitations on activity for underlying diseases you may have. SEEK MEDICAL CARE IF:   Your condition is not improving after your transfusion.  You develop redness or irritation at the intravenous (IV) site. SEEK IMMEDIATE MEDICAL CARE IF:  Any of the following symptoms occur over the next 12 hours:  Shaking chills.  You have a temperature by mouth above 102 F (38.9  C), not controlled by medicine.  Chest, back, or muscle pain.  People around you feel you are not acting correctly or are confused.  Shortness of breath or difficulty breathing.  Dizziness and fainting.  You get a rash or develop hives.  You have a decrease in urine output.  Your urine turns a dark color or changes to pink, red, or brown. Any of the following symptoms occur over the next 10 days:  You have a temperature by mouth above 102 F (38.9 C), not controlled by medicine.  Shortness of breath.  Weakness after normal activity.  The white part of the eye turns yellow (jaundice).  You have a decrease in the amount of urine or are urinating less often.  Your urine turns a dark color or changes to pink, red, or brown. Document Released: 09/14/2000 Document Revised: 12/10/2011 Document Reviewed: 05/03/2008 [facility] Patient Information 2014 [place], [place].  _______________________________________________________________________

## 2018-03-19 ENCOUNTER — Encounter (HOSPITAL_COMMUNITY)
Admission: RE | Admit: 2018-03-19 | Discharge: 2018-03-19 | Disposition: A | Discharge status: Home / Self Care | Payer: Medicare Other | Source: Ambulatory Visit | Attending: Orthopedic Surgery | Admitting: Orthopedic Surgery

## 2018-03-19 ENCOUNTER — Other Ambulatory Visit: Payer: Self-pay

## 2018-03-19 ENCOUNTER — Encounter (HOSPITAL_COMMUNITY): Payer: Self-pay

## 2018-03-19 DIAGNOSIS — M25561 Pain in right knee: Secondary | ICD-10-CM | POA: Insufficient documentation

## 2018-03-19 DIAGNOSIS — Z01812 Encounter for preprocedural laboratory examination: Secondary | ICD-10-CM | POA: Diagnosis not present

## 2018-03-19 DIAGNOSIS — M1711 Unilateral primary osteoarthritis, right knee: Secondary | ICD-10-CM | POA: Diagnosis not present

## 2018-03-19 LAB — HEMOGLOBIN A1C
HEMOGLOBIN A1C: 6.6 % — AB (ref 4.8–5.6)
MEAN PLASMA GLUCOSE: 142.72 mg/dL

## 2018-03-19 LAB — SURGICAL PCR SCREEN
MRSA, PCR: NEGATIVE
Staphylococcus aureus: NEGATIVE

## 2018-03-19 NOTE — Progress Notes (Signed)
03-19-18 Awaiting PCR result. Chart left with Altamese CarolinaMisty Moore, RN

## 2018-03-24 MED ORDER — TRANEXAMIC ACID 1000 MG/10ML IV SOLN
1000.0000 mg | INTRAVENOUS | Status: AC
  Administered 2018-03-25: 1000 mg via INTRAVENOUS
  Filled 2018-03-24: qty 1100

## 2018-03-25 ENCOUNTER — Other Ambulatory Visit: Payer: Self-pay

## 2018-03-25 ENCOUNTER — Encounter (HOSPITAL_COMMUNITY)
Admission: RE | Disposition: A | Discharge status: Home / Self Care | Payer: Self-pay | Source: Ambulatory Visit | Attending: Orthopedic Surgery

## 2018-03-25 ENCOUNTER — Inpatient Hospital Stay (HOSPITAL_COMMUNITY): Payer: Medicare Other | Admitting: Registered Nurse

## 2018-03-25 ENCOUNTER — Encounter (HOSPITAL_COMMUNITY): Payer: Self-pay | Admitting: Emergency Medicine

## 2018-03-25 ENCOUNTER — Observation Stay (HOSPITAL_COMMUNITY)
Admission: RE | Admit: 2018-03-25 | Discharge: 2018-03-26 | Disposition: A | Discharge status: Home / Self Care | Payer: Medicare Other | Source: Ambulatory Visit | Attending: Orthopedic Surgery | Admitting: Orthopedic Surgery

## 2018-03-25 DIAGNOSIS — Z881 Allergy status to other antibiotic agents status: Secondary | ICD-10-CM | POA: Insufficient documentation

## 2018-03-25 DIAGNOSIS — R739 Hyperglycemia, unspecified: Secondary | ICD-10-CM | POA: Diagnosis not present

## 2018-03-25 DIAGNOSIS — G8918 Other acute postprocedural pain: Secondary | ICD-10-CM | POA: Diagnosis not present

## 2018-03-25 DIAGNOSIS — Z7982 Long term (current) use of aspirin: Secondary | ICD-10-CM | POA: Insufficient documentation

## 2018-03-25 DIAGNOSIS — Z6839 Body mass index (BMI) 39.0-39.9, adult: Secondary | ICD-10-CM | POA: Insufficient documentation

## 2018-03-25 DIAGNOSIS — Z823 Family history of stroke: Secondary | ICD-10-CM | POA: Diagnosis not present

## 2018-03-25 DIAGNOSIS — Z8249 Family history of ischemic heart disease and other diseases of the circulatory system: Secondary | ICD-10-CM | POA: Diagnosis not present

## 2018-03-25 DIAGNOSIS — Z882 Allergy status to sulfonamides status: Secondary | ICD-10-CM | POA: Insufficient documentation

## 2018-03-25 DIAGNOSIS — J309 Allergic rhinitis, unspecified: Secondary | ICD-10-CM | POA: Insufficient documentation

## 2018-03-25 DIAGNOSIS — Z96659 Presence of unspecified artificial knee joint: Secondary | ICD-10-CM

## 2018-03-25 DIAGNOSIS — Z9841 Cataract extraction status, right eye: Secondary | ICD-10-CM | POA: Diagnosis not present

## 2018-03-25 DIAGNOSIS — Z87891 Personal history of nicotine dependence: Secondary | ICD-10-CM | POA: Insufficient documentation

## 2018-03-25 DIAGNOSIS — E669 Obesity, unspecified: Secondary | ICD-10-CM | POA: Diagnosis not present

## 2018-03-25 DIAGNOSIS — Z90710 Acquired absence of both cervix and uterus: Secondary | ICD-10-CM | POA: Insufficient documentation

## 2018-03-25 DIAGNOSIS — M1711 Unilateral primary osteoarthritis, right knee: Principal | ICD-10-CM | POA: Insufficient documentation

## 2018-03-25 DIAGNOSIS — Z888 Allergy status to other drugs, medicaments and biological substances status: Secondary | ICD-10-CM | POA: Insufficient documentation

## 2018-03-25 DIAGNOSIS — Z96652 Presence of left artificial knee joint: Secondary | ICD-10-CM | POA: Diagnosis not present

## 2018-03-25 DIAGNOSIS — Z79899 Other long term (current) drug therapy: Secondary | ICD-10-CM | POA: Diagnosis not present

## 2018-03-25 DIAGNOSIS — R51 Headache: Secondary | ICD-10-CM | POA: Insufficient documentation

## 2018-03-25 HISTORY — PX: TOTAL KNEE ARTHROPLASTY: SHX125

## 2018-03-25 LAB — TYPE AND SCREEN
ABO/RH(D): O POS
ANTIBODY SCREEN: NEGATIVE

## 2018-03-25 SURGERY — ARTHROPLASTY, KNEE, TOTAL
Anesthesia: Spinal | Site: Knee | Laterality: Right

## 2018-03-25 MED ORDER — ROPIVACAINE HCL 7.5 MG/ML IJ SOLN
INTRAMUSCULAR | Status: DC | PRN
  Administered 2018-03-25: 20 mL via PERINEURAL

## 2018-03-25 MED ORDER — CEFAZOLIN SODIUM-DEXTROSE 2-4 GM/100ML-% IV SOLN
2.0000 g | INTRAVENOUS | Status: AC
  Administered 2018-03-25: 2 g via INTRAVENOUS
  Filled 2018-03-25: qty 100

## 2018-03-25 MED ORDER — SODIUM CHLORIDE 0.9 % IJ SOLN
INTRAMUSCULAR | Status: DC | PRN
  Administered 2018-03-25: 30 mL

## 2018-03-25 MED ORDER — DIPHENHYDRAMINE HCL 12.5 MG/5ML PO ELIX: 12.5000 mg | ORAL_SOLUTION | ORAL | Status: DC | PRN

## 2018-03-25 MED ORDER — BUPIVACAINE-EPINEPHRINE (PF) 0.25% -1:200000 IJ SOLN
INTRAMUSCULAR | Status: AC
  Filled 2018-03-25: qty 30

## 2018-03-25 MED ORDER — ONDANSETRON HCL 4 MG/2ML IJ SOLN
INTRAMUSCULAR | Status: AC
  Filled 2018-03-25: qty 2

## 2018-03-25 MED ORDER — ASPIRIN 81 MG PO CHEW: 81.0000 mg | CHEWABLE_TABLET | Freq: Two times a day (BID) | ORAL | 0 refills | Status: AC

## 2018-03-25 MED ORDER — HYDROCODONE-ACETAMINOPHEN 5-325 MG PO TABS
1.0000 | ORAL_TABLET | ORAL | Status: DC | PRN
  Administered 2018-03-25: 1 via ORAL
  Filled 2018-03-25: qty 1

## 2018-03-25 MED ORDER — SODIUM CHLORIDE 0.9 % IV SOLN
INTRAVENOUS | Status: DC
  Administered 2018-03-25: 15:00:00 via INTRAVENOUS

## 2018-03-25 MED ORDER — ACETAMINOPHEN 325 MG PO TABS: 325.0000 mg | ORAL_TABLET | Freq: Four times a day (QID) | ORAL | Status: DC | PRN

## 2018-03-25 MED ORDER — DEXAMETHASONE SODIUM PHOSPHATE 10 MG/ML IJ SOLN
INTRAMUSCULAR | Status: AC
  Filled 2018-03-25: qty 1

## 2018-03-25 MED ORDER — DIAZEPAM 5 MG PO TABS
5.0000 mg | ORAL_TABLET | Freq: Every day | ORAL | Status: DC
  Administered 2018-03-25: 5 mg via ORAL
  Filled 2018-03-25: qty 1

## 2018-03-25 MED ORDER — DOCUSATE SODIUM 100 MG PO CAPS
100.0000 mg | ORAL_CAPSULE | Freq: Two times a day (BID) | ORAL | Status: DC
  Administered 2018-03-25 – 2018-03-26 (×2): 100 mg via ORAL
  Filled 2018-03-25 (×2): qty 1

## 2018-03-25 MED ORDER — METOCLOPRAMIDE HCL 5 MG/ML IJ SOLN: 5.0000 mg | Freq: Three times a day (TID) | INTRAMUSCULAR | Status: DC | PRN

## 2018-03-25 MED ORDER — TRAMADOL HCL 50 MG PO TABS
50.0000 mg | ORAL_TABLET | Freq: Four times a day (QID) | ORAL | Status: DC
  Administered 2018-03-25 – 2018-03-26 (×4): 50 mg via ORAL
  Filled 2018-03-25 (×4): qty 1

## 2018-03-25 MED ORDER — METHOCARBAMOL 500 MG PO TABS: 500.0000 mg | ORAL_TABLET | Freq: Four times a day (QID) | ORAL | 0 refills | Status: DC | PRN

## 2018-03-25 MED ORDER — ONDANSETRON HCL 4 MG/2ML IJ SOLN: 4.0000 mg | Freq: Four times a day (QID) | INTRAMUSCULAR | Status: DC | PRN

## 2018-03-25 MED ORDER — STERILE WATER FOR IRRIGATION IR SOLN
Status: DC | PRN
  Administered 2018-03-25: 2000 mL

## 2018-03-25 MED ORDER — FERROUS SULFATE 325 (65 FE) MG PO TABS: 325.0000 mg | ORAL_TABLET | Freq: Three times a day (TID) | ORAL | 3 refills | Status: DC

## 2018-03-25 MED ORDER — BISACODYL 10 MG RE SUPP: 10.0000 mg | Freq: Every day | RECTAL | Status: DC | PRN

## 2018-03-25 MED ORDER — HYDROMORPHONE HCL 1 MG/ML IJ SOLN: 0.2500 mg | INTRAMUSCULAR | Status: DC | PRN

## 2018-03-25 MED ORDER — METHOCARBAMOL 1000 MG/10ML IJ SOLN
500.0000 mg | Freq: Four times a day (QID) | INTRAVENOUS | Status: DC | PRN
  Administered 2018-03-25: 500 mg via INTRAVENOUS
  Filled 2018-03-25: qty 550

## 2018-03-25 MED ORDER — MORPHINE SULFATE (PF) 2 MG/ML IV SOLN
0.5000 mg | INTRAVENOUS | Status: DC | PRN
  Administered 2018-03-25 – 2018-03-26 (×3): 1 mg via INTRAVENOUS
  Filled 2018-03-25 (×3): qty 1

## 2018-03-25 MED ORDER — ASPIRIN 81 MG PO CHEW
81.0000 mg | CHEWABLE_TABLET | Freq: Two times a day (BID) | ORAL | Status: DC
  Administered 2018-03-25 – 2018-03-26 (×2): 81 mg via ORAL
  Filled 2018-03-25 (×2): qty 1

## 2018-03-25 MED ORDER — MAGNESIUM CITRATE PO SOLN: 1.0000 | Freq: Once | ORAL | Status: DC | PRN

## 2018-03-25 MED ORDER — SODIUM CHLORIDE 0.9 % IJ SOLN
INTRAMUSCULAR | Status: AC
  Filled 2018-03-25: qty 50

## 2018-03-25 MED ORDER — MENTHOL 3 MG MT LOZG: 1.0000 | LOZENGE | OROMUCOSAL | Status: DC | PRN

## 2018-03-25 MED ORDER — CHLORHEXIDINE GLUCONATE 4 % EX LIQD: 60.0000 mL | Freq: Once | CUTANEOUS | Status: DC

## 2018-03-25 MED ORDER — SODIUM CHLORIDE 0.9 % IR SOLN
Status: DC | PRN
  Administered 2018-03-25: 1000 mL

## 2018-03-25 MED ORDER — DOCUSATE SODIUM 100 MG PO CAPS: 100.0000 mg | ORAL_CAPSULE | Freq: Two times a day (BID) | ORAL | 0 refills | Status: DC

## 2018-03-25 MED ORDER — HYDROCODONE-ACETAMINOPHEN 7.5-325 MG PO TABS: 1.0000 | ORAL_TABLET | ORAL | 0 refills | Status: DC | PRN

## 2018-03-25 MED ORDER — ONDANSETRON HCL 4 MG/2ML IJ SOLN
INTRAMUSCULAR | Status: DC | PRN
  Administered 2018-03-25: 4 mg via INTRAVENOUS

## 2018-03-25 MED ORDER — FENTANYL CITRATE (PF) 100 MCG/2ML IJ SOLN
50.0000 ug | INTRAMUSCULAR | Status: DC
  Administered 2018-03-25: 50 ug via INTRAVENOUS
  Filled 2018-03-25: qty 2

## 2018-03-25 MED ORDER — BUPIVACAINE IN DEXTROSE 0.75-8.25 % IT SOLN
INTRATHECAL | Status: DC | PRN
  Administered 2018-03-25: 1.8 mL via INTRATHECAL

## 2018-03-25 MED ORDER — PROPOFOL 10 MG/ML IV BOLUS
INTRAVENOUS | Status: AC
  Filled 2018-03-25: qty 20

## 2018-03-25 MED ORDER — HYDROCODONE-ACETAMINOPHEN 7.5-325 MG PO TABS
1.0000 | ORAL_TABLET | ORAL | Status: DC | PRN
  Administered 2018-03-25 – 2018-03-26 (×5): 2 via ORAL
  Filled 2018-03-25 (×5): qty 2

## 2018-03-25 MED ORDER — POLYETHYLENE GLYCOL 3350 17 G PO PACK: 17.0000 g | PACK | Freq: Two times a day (BID) | ORAL | 0 refills | Status: AC

## 2018-03-25 MED ORDER — FLUTICASONE PROPIONATE 50 MCG/ACT NA SUSP: 1.0000 | Freq: Every evening | NASAL | Status: DC | PRN

## 2018-03-25 MED ORDER — KETOROLAC TROMETHAMINE 30 MG/ML IJ SOLN
INTRAMUSCULAR | Status: AC
  Filled 2018-03-25: qty 1

## 2018-03-25 MED ORDER — POLYETHYLENE GLYCOL 3350 17 G PO PACK
17.0000 g | PACK | Freq: Two times a day (BID) | ORAL | Status: DC
  Administered 2018-03-25 – 2018-03-26 (×2): 17 g via ORAL
  Filled 2018-03-25 (×2): qty 1

## 2018-03-25 MED ORDER — METHOCARBAMOL 500 MG PO TABS
500.0000 mg | ORAL_TABLET | Freq: Four times a day (QID) | ORAL | Status: DC | PRN
  Administered 2018-03-25 – 2018-03-26 (×3): 500 mg via ORAL
  Filled 2018-03-25 (×3): qty 1

## 2018-03-25 MED ORDER — LACTATED RINGERS IV SOLN
INTRAVENOUS | Status: DC
  Administered 2018-03-25: 11:00:00 via INTRAVENOUS

## 2018-03-25 MED ORDER — DEXAMETHASONE SODIUM PHOSPHATE 10 MG/ML IJ SOLN
10.0000 mg | Freq: Once | INTRAMUSCULAR | Status: AC
  Administered 2018-03-25: 8 mg via INTRAVENOUS

## 2018-03-25 MED ORDER — ONDANSETRON HCL 4 MG PO TABS: 4.0000 mg | ORAL_TABLET | Freq: Four times a day (QID) | ORAL | Status: DC | PRN

## 2018-03-25 MED ORDER — BUPIVACAINE-EPINEPHRINE (PF) 0.25% -1:200000 IJ SOLN
INTRAMUSCULAR | Status: DC | PRN
  Administered 2018-03-25: 30 mL

## 2018-03-25 MED ORDER — PHENOL 1.4 % MT LIQD: 1.0000 | OROMUCOSAL | Status: DC | PRN

## 2018-03-25 MED ORDER — KETOROLAC TROMETHAMINE 30 MG/ML IJ SOLN
INTRAMUSCULAR | Status: DC | PRN
  Administered 2018-03-25: 30 mg

## 2018-03-25 MED ORDER — TRANEXAMIC ACID 1000 MG/10ML IV SOLN
1000.0000 mg | Freq: Once | INTRAVENOUS | Status: AC
  Administered 2018-03-25: 1000 mg via INTRAVENOUS
  Filled 2018-03-25: qty 1100

## 2018-03-25 MED ORDER — CEFAZOLIN SODIUM-DEXTROSE 2-4 GM/100ML-% IV SOLN
2.0000 g | Freq: Four times a day (QID) | INTRAVENOUS | Status: AC
  Administered 2018-03-25 (×2): 2 g via INTRAVENOUS
  Filled 2018-03-25 (×2): qty 100

## 2018-03-25 MED ORDER — METOCLOPRAMIDE HCL 5 MG PO TABS: 5.0000 mg | ORAL_TABLET | Freq: Three times a day (TID) | ORAL | Status: DC | PRN

## 2018-03-25 MED ORDER — FERROUS SULFATE 325 (65 FE) MG PO TABS
325.0000 mg | ORAL_TABLET | Freq: Two times a day (BID) | ORAL | Status: DC
  Administered 2018-03-25 – 2018-03-26 (×2): 325 mg via ORAL
  Filled 2018-03-25 (×2): qty 1

## 2018-03-25 MED ORDER — MIDAZOLAM HCL 2 MG/2ML IJ SOLN
1.0000 mg | INTRAMUSCULAR | Status: DC
  Administered 2018-03-25: 2 mg via INTRAVENOUS
  Filled 2018-03-25: qty 2

## 2018-03-25 MED ORDER — ALUM & MAG HYDROXIDE-SIMETH 200-200-20 MG/5ML PO SUSP: 15.0000 mL | ORAL | Status: DC | PRN

## 2018-03-25 MED ORDER — PROPOFOL 500 MG/50ML IV EMUL
INTRAVENOUS | Status: DC | PRN
  Administered 2018-03-25: 40 ug/kg/min via INTRAVENOUS

## 2018-03-25 MED ORDER — PROPOFOL 10 MG/ML IV BOLUS
INTRAVENOUS | Status: AC
  Filled 2018-03-25: qty 40

## 2018-03-25 SURGICAL SUPPLY — 44 items
BAG ZIPLOCK 12X15 (MISCELLANEOUS) IMPLANT
BANDAGE ACE 6X5 VEL STRL LF (GAUZE/BANDAGES/DRESSINGS) ×3 IMPLANT
BLADE SAW SGTL 11.0X1.19X90.0M (BLADE) ×3 IMPLANT
BLADE SAW SGTL 13.0X1.19X90.0M (BLADE) ×3 IMPLANT
BOWL SMART MIX CTS (DISPOSABLE) ×3 IMPLANT
CAPT KNEE TOTAL 3 ATTUNE ×3 IMPLANT
CEMENT HV SMART SET (Cement) ×6 IMPLANT
COVER SURGICAL LIGHT HANDLE (MISCELLANEOUS) ×3 IMPLANT
CUFF TOURN SGL QUICK 34 (TOURNIQUET CUFF) ×2
CUFF TRNQT CYL 34X4X40X1 (TOURNIQUET CUFF) ×1 IMPLANT
DECANTER SPIKE VIAL GLASS SM (MISCELLANEOUS) ×6 IMPLANT
DERMABOND ADVANCED (GAUZE/BANDAGES/DRESSINGS) ×2
DERMABOND ADVANCED .7 DNX12 (GAUZE/BANDAGES/DRESSINGS) ×1 IMPLANT
DRAPE U-SHAPE 47X51 STRL (DRAPES) ×3 IMPLANT
DRESSING AQUACEL AG SP 3.5X10 (GAUZE/BANDAGES/DRESSINGS) ×1 IMPLANT
DRSG AQUACEL AG ADV 3.5X14 (GAUZE/BANDAGES/DRESSINGS) ×3 IMPLANT
DRSG AQUACEL AG SP 3.5X10 (GAUZE/BANDAGES/DRESSINGS) ×3
DURAPREP 26ML APPLICATOR (WOUND CARE) ×6 IMPLANT
ELECT REM PT RETURN 15FT ADLT (MISCELLANEOUS) ×3 IMPLANT
GLOVE BIOGEL PI IND STRL 7.5 (GLOVE) ×1 IMPLANT
GLOVE BIOGEL PI IND STRL 8.5 (GLOVE) ×1 IMPLANT
GLOVE BIOGEL PI INDICATOR 7.5 (GLOVE) ×2
GLOVE BIOGEL PI INDICATOR 8.5 (GLOVE) ×2
GLOVE ECLIPSE 8.0 STRL XLNG CF (GLOVE) ×6 IMPLANT
GLOVE ORTHO TXT STRL SZ7.5 (GLOVE) ×3 IMPLANT
GOWN STRL REUS W/TWL 2XL LVL3 (GOWN DISPOSABLE) ×3 IMPLANT
GOWN STRL REUS W/TWL LRG LVL3 (GOWN DISPOSABLE) ×6 IMPLANT
HANDPIECE INTERPULSE COAX TIP (DISPOSABLE) ×2
HOLDER FOLEY CATH W/STRAP (MISCELLANEOUS) ×3 IMPLANT
MANIFOLD NEPTUNE II (INSTRUMENTS) ×3 IMPLANT
PACK TOTAL KNEE CUSTOM (KITS) ×3 IMPLANT
POSITIONER SURGICAL ARM (MISCELLANEOUS) ×3 IMPLANT
SET HNDPC FAN SPRY TIP SCT (DISPOSABLE) ×1 IMPLANT
SET PAD KNEE POSITIONER (MISCELLANEOUS) ×3 IMPLANT
SUT MNCRL AB 4-0 PS2 18 (SUTURE) ×3 IMPLANT
SUT STRATAFIX PDS+ 0 24IN (SUTURE) ×3 IMPLANT
SUT VIC AB 1 CT1 36 (SUTURE) ×3 IMPLANT
SUT VIC AB 2-0 CT1 27 (SUTURE) ×6
SUT VIC AB 2-0 CT1 TAPERPNT 27 (SUTURE) ×3 IMPLANT
SYRINGE 3CC LL L/F (MISCELLANEOUS) IMPLANT
TRAY FOLEY CATH 14FRSI W/METER (CATHETERS) ×3 IMPLANT
WATER STERILE IRR 1000ML POUR (IV SOLUTION) ×6 IMPLANT
WRAP KNEE MAXI GEL POST OP (GAUZE/BANDAGES/DRESSINGS) ×3 IMPLANT
YANKAUER SUCT BULB TIP 10FT TU (MISCELLANEOUS) ×3 IMPLANT

## 2018-03-25 NOTE — Anesthesia Preprocedure Evaluation (Addendum)
Anesthesia Evaluation  Patient identified by MRN, date of birth, ID band Patient awake    Reviewed: Allergy & Precautions, H&P , NPO status , Patient's Chart, lab work & pertinent test results  Airway Mallampati: II  TM Distance: >3 FB Neck ROM: Full    Dental no notable dental hx. (+) Teeth Intact, Dental Advisory Given   Pulmonary neg pulmonary ROS, former smoker,    Pulmonary exam normal breath sounds clear to auscultation       Cardiovascular negative cardio ROS   Rhythm:Regular Rate:Normal     Neuro/Psych negative neurological ROS  negative psych ROS   GI/Hepatic negative GI ROS, Neg liver ROS,   Endo/Other  Morbid obesity  Renal/GU negative Renal ROS  negative genitourinary   Musculoskeletal  (+) Arthritis , Osteoarthritis,    Abdominal   Peds  Hematology negative hematology ROS (+)   Anesthesia Other Findings   Reproductive/Obstetrics negative OB ROS                            Anesthesia Physical Anesthesia Plan  ASA: II  Anesthesia Plan: Spinal   Post-op Pain Management:  Regional for Post-op pain   Induction: Intravenous  PONV Risk Score and Plan: 3 and Ondansetron, Propofol infusion and Midazolam  Airway Management Planned: Simple Face Mask  Additional Equipment:   Intra-op Plan:   Post-operative Plan:   Informed Consent: I have reviewed the patients History and Physical, chart, labs and discussed the procedure including the risks, benefits and alternatives for the proposed anesthesia with the patient or authorized representative who has indicated his/her understanding and acceptance.   Dental advisory given  Plan Discussed with: CRNA  Anesthesia Plan Comments:         Anesthesia Quick Evaluation

## 2018-03-25 NOTE — Anesthesia Procedure Notes (Signed)
Anesthesia Regional Block: Adductor canal block   Pre-Anesthetic Checklist: ,, timeout performed, Correct Patient, Correct Site, Correct Laterality, Correct Procedure, Correct Position, site marked, Risks and benefits discussed, pre-op evaluation,  At surgeon's request and post-op pain management  Laterality: Right  Prep: Maximum Sterile Barrier Precautions used, chloraprep       Needles:  Injection technique: Single-shot  Needle Type: Echogenic Stimulator Needle     Needle Length: 9cm  Needle Gauge: 21     Additional Needles:   Procedures:,,,, ultrasound used (permanent image in chart),,,,  Narrative:  Start time: 03/25/2018 10:57 AM End time: 03/25/2018 11:07 AM Injection made incrementally with aspirations every 5 mL.  Performed by: Personally  Anesthesiologist: Gaynelle AduFitzgerald, Kairos Panetta, MD  Additional Notes: 2% Lidocaine skin wheel.

## 2018-03-25 NOTE — Anesthesia Postprocedure Evaluation (Signed)
Anesthesia Post Note  Patient: Tammy Hart  Procedure(s) Performed: RIGHT TOTAL KNEE ARTHROPLASTY (Right Knee)     Patient location during evaluation: PACU Anesthesia Type: Spinal and Regional Level of consciousness: oriented and awake and alert Pain management: pain level controlled Vital Signs Assessment: post-procedure vital signs reviewed and stable Respiratory status: spontaneous breathing, respiratory function stable and patient connected to nasal cannula oxygen Cardiovascular status: blood pressure returned to baseline and stable Postop Assessment: no headache, no backache, no apparent nausea or vomiting, patient able to bend at knees and spinal receding Anesthetic complications: no    Last Vitals:  Vitals:   03/25/18 1445 03/25/18 1500  BP: 136/75 (!) 142/72  Pulse: 76 82  Resp: 15 16  Temp: 36.6 C 36.4 C  SpO2: 100% 98%    Last Pain:  Vitals:   03/25/18 1445  TempSrc:   PainSc: 0-No pain                 Jermel Artley,W. EDMOND

## 2018-03-25 NOTE — Anesthesia Procedure Notes (Signed)
Spinal  Start time: 03/25/2018 12:00 PM End time: 03/25/2018 12:06 PM Staffing Resident/CRNA: Victoriano Lain, CRNA Preanesthetic Checklist Completed: patient identified, site marked, surgical consent, pre-op evaluation, timeout performed, IV checked, risks and benefits discussed and monitors and equipment checked Spinal Block Patient position: sitting Prep: site prepped and draped and DuraPrep Patient monitoring: cardiac monitor, continuous pulse ox and blood pressure Approach: midline Location: L3-4 Injection technique: single-shot Needle Needle type: Pencan  Needle gauge: 24 G Needle length: 10 cm Assessment Sensory level: T4 Additional Notes Pt placed in sitting position for spinal placement. Spinal kit expiration date checked and verified. + CSF, - heme. Pt tolerated well.

## 2018-03-25 NOTE — Interval H&P Note (Signed)
History and Physical Interval Note:  03/25/2018 10:54 AM  Tammy Hart  has presented today for surgery, with the diagnosis of Right knee osteoarthitis  The various methods of treatment have been discussed with the patient and family. After consideration of risks, benefits and other options for treatment, the patient has consented to  Procedure(s) with comments: RIGHT TOTAL KNEE ARTHROPLASTY (Right) - 70 mins as a surgical intervention .  The patient's history has been reviewed, patient examined, no change in status, stable for surgery.  I have reviewed the patient's chart and labs.  Questions were answered to the patient's satisfaction.     Shelda PalMatthew D Stalin Gruenberg

## 2018-03-25 NOTE — Transfer of Care (Signed)
Immediate Anesthesia Transfer of Care Note  Patient: Tammy Hart  Procedure(s) Performed: RIGHT TOTAL KNEE ARTHROPLASTY (Right Knee)  Patient Location: PACU  Anesthesia Type:Spinal  Level of Consciousness: awake, alert , oriented and patient cooperative  Airway & Oxygen Therapy: Patient Spontanous Breathing and Patient connected to face mask oxygen  Post-op Assessment: Report given to RN and Post -op Vital signs reviewed and stable  Post vital signs: Reviewed and stable  Last Vitals:  Vitals Value Taken Time  BP 112/72 03/25/2018  1:52 PM  Temp    Pulse 82 03/25/2018  1:53 PM  Resp 13 03/25/2018  1:53 PM  SpO2 100 % 03/25/2018  1:53 PM  Vitals shown include unvalidated device data.  Last Pain:  Vitals:   03/25/18 1033  TempSrc:   PainSc: 3       Patients Stated Pain Goal: 4 (03/25/18 1033)  Complications: No apparent anesthesia complications

## 2018-03-25 NOTE — Progress Notes (Signed)
AssistedDr. Edmond Fitzgerald with right, ultrasound guided, adductor canal block. Side rails up, monitors on throughout procedure. See vital signs in flow sheet. Tolerated Procedure well.  

## 2018-03-25 NOTE — Discharge Instructions (Signed)

## 2018-03-25 NOTE — Op Note (Signed)
NAME:  Tammy Hart                      MEDICAL RECORD NO.:  [id number]                             [facility]      PHYSICIAN:  Madlyn FrankelMatthew D. Charlann Boxerlin, M.D.  DATE OF BIRTH:  08/26/1951      DATE OF PROCEDURE:  03/25/2018                                     OPERATIVE REPORT         PREOPERATIVE DIAGNOSIS:  Right knee osteoarthritis.      POSTOPERATIVE DIAGNOSIS:  Right knee osteoarthritis.      FINDINGS:  The patient was noted to have complete loss of cartilage and   bone-on-bone arthritis with associated osteophytes in the lateral and patellofemoral compartments of   the knee with a significant synovitis and associated effusion.  The patient had failed months of conservative treatment including medications, injection therapy, activity modification.     PROCEDURE:  Right total knee replacement.      COMPONENTS USED:  DePuy Attune rotating platform posterior stabilized knee   system, a size 5 femur, 5 tibia, size 7 mm PS AOX insert, and 35 anatomic patellar   button.      SURGEON:  Madlyn FrankelMatthew D. Charlann Boxerlin, M.D.      ASSISTANT:  Lanney GinsMatthew Babish, PA-C.      ANESTHESIA:  Regional and Spinal.      SPECIMENS:  None.      COMPLICATION:  None.      DRAINS:  None.  EBL: <100cc      TOURNIQUET TIME:   Total Tourniquet Time Documented: Thigh (Right) - 26 minutes Total: Thigh (Right) - 26 minutes  .     The patient was stable to the recovery room.      INDICATION FOR PROCEDURE:  Tammy Hart is a 67 y.o. female patient of   mine.  The patient had been seen, evaluated, and treated for months conservatively in the   office with medication, activity modification, and injections.  The patient had   radiographic changes of bone-on-bone arthritis with endplate sclerosis and osteophytes noted.  Based on the radiographic changes and failed conservative measures, the patient   decided to proceed with definitive treatment, total knee replacement.  Risks of infection, DVT, component failure, need  for revision surgery, neurovascular injury were reviewed in the office setting.  The postop course was reviewed stressing the efforts to maximize post-operative satisfaction and function.  Consent was obtained for benefit of pain   relief.      PROCEDURE IN DETAIL:  The patient was brought to the operative theater.   Once adequate anesthesia, preoperative antibiotics, 2 gm of Ancef,1 gm of Tranexamic Acid, and 10 mg of Decadron administered, the patient was positioned supine with a right thigh tourniquet placed.  The  right lower extremity was prepped and draped in sterile fashion.  A time-   out was performed identifying the patient, planned procedure, and the appropriate extremity.      The right lower [facility]  The leg was   exsanguinated, tourniquet elevated to 250 mmHg.  A midline incision was   made followed  by median parapatellar arthrotomy.  Following initial   exposure, attention was first directed to the patella.  Precut   measurement was noted to be 22 mm.  I resected down to 13-14 mm and used a   35 anatomic patellar button to restore patellar height as well as cover the cut surface.      The lug holes were drilled and a metal shim was placed to protect the   patella from retractors and saw blade during the procedure.      At this point, attention was now directed to the femur.  The femoral   canal was opened with a drill, irrigated to try to prevent fat emboli.  An   intramedullary rod was passed at 3 degrees valgus, 9 mm of bone was   resected off the distal femur.  Following this resection, the tibia was   subluxated anteriorly.  Using the extramedullary guide, 2 mm of bone was resected off   the proximal lateral tibia.  We confirmed the gap would be   stable medially and laterally with a size 5 spacer block as well as confirmed that the tibial cut was perpendicular in the coronal plane, checking with an alignment rod.      Once this was  done, I sized the femur to be a size 5 in the anterior-   posterior dimension, chose a standard component based on medial and   lateral dimension.  The size 5 rotation block was then pinned in   position anterior referenced using the C-clamp to set rotation.  The   anterior, posterior, and  chamfer cuts were made without difficulty nor   notching making certain that I was along the anterior cortex to help   with flexion gap stability.      The final box cut was made off the lateral aspect of distal femur.      At this point, the tibia was sized to be a size 5.  The size 5 tray was   then pinned in position through the medial third of the tubercle,   drilled, and keel punched.  Trial reduction was now carried with a 5 femur,  5 tibia, a size 7 mm PS insert, and the 35 anatomic patella botton.  The knee was brought to full extension with good flexion stability with the patella   tracking through the trochlea without application of pressure.  Given   all these findings the trial components removed.  Final components were   opened and cement was mixed.  The knee was irrigated with normal saline solution and pulse lavage.  The synovial lining was   then injected with 30 cc of 0.25% Marcaine with epinephrine, 1 cc of Toradol and 30 cc of NS for a total of 61 cc.     Final implants were then cemented onto cleaned and dried cut surfaces of bone with the knee brought to extension with a size 7 mm PS trial insert.      Once the cement had fully cured, excess cement was removed   throughout the knee.  I confirmed that I was satisfied with the range of   motion and stability, and the final size 7 mm PS AOX insert was chosen.  It was   placed into the knee.      The tourniquet had been let down at 26 minutes.  No significant   hemostasis was required.  The extensor mechanism was then reapproximated using #1 Vicryl and #1  Stratafix sutures with the knee   in flexion.  The   remaining wound was closed  with 2-0 Vicryl and running 4-0 Monocryl.   The knee was cleaned, dried, dressed sterilely using Dermabond and   Aquacel dressing.  The patient was then   brought to recovery room in stable condition, tolerating the procedure   well.   Please note that Physician Assistant, Lanney Gins, PA-C was present for the entirety of the case, and was utilized for pre-operative positioning, peri-operative retractor management, general facilitation of the procedure and for primary wound closure at the end of the case.              Madlyn Frankel Charlann Boxer, M.D.    03/25/2018 1:20 PM

## 2018-03-26 ENCOUNTER — Encounter (HOSPITAL_COMMUNITY): Payer: Self-pay | Admitting: Orthopedic Surgery

## 2018-03-26 DIAGNOSIS — E669 Obesity, unspecified: Secondary | ICD-10-CM | POA: Diagnosis not present

## 2018-03-26 DIAGNOSIS — R739 Hyperglycemia, unspecified: Secondary | ICD-10-CM | POA: Diagnosis not present

## 2018-03-26 DIAGNOSIS — J309 Allergic rhinitis, unspecified: Secondary | ICD-10-CM | POA: Diagnosis not present

## 2018-03-26 DIAGNOSIS — Z6839 Body mass index (BMI) 39.0-39.9, adult: Secondary | ICD-10-CM | POA: Diagnosis not present

## 2018-03-26 DIAGNOSIS — R51 Headache: Secondary | ICD-10-CM | POA: Diagnosis not present

## 2018-03-26 DIAGNOSIS — M1711 Unilateral primary osteoarthritis, right knee: Secondary | ICD-10-CM | POA: Diagnosis not present

## 2018-03-26 LAB — BASIC METABOLIC PANEL
ANION GAP: 7 (ref 5–15)
BUN: 16 mg/dL (ref 8–23)
CALCIUM: 8.7 mg/dL — AB (ref 8.9–10.3)
CO2: 27 mmol/L (ref 22–32)
CREATININE: 0.75 mg/dL (ref 0.44–1.00)
Chloride: 106 mmol/L (ref 98–111)
GLUCOSE: 163 mg/dL — AB (ref 70–99)
Potassium: 4.7 mmol/L (ref 3.5–5.1)
Sodium: 140 mmol/L (ref 135–145)

## 2018-03-26 LAB — CBC
HCT: 36.4 % (ref 36.0–46.0)
Hemoglobin: 11.5 g/dL — ABNORMAL LOW (ref 12.0–15.0)
MCH: 30.2 pg (ref 26.0–34.0)
MCHC: 31.6 g/dL (ref 30.0–36.0)
MCV: 95.5 fL (ref 78.0–100.0)
PLATELETS: 284 10*3/uL (ref 150–400)
RBC: 3.81 MIL/uL — ABNORMAL LOW (ref 3.87–5.11)
RDW: 14.1 % (ref 11.5–15.5)
WBC: 14.3 10*3/uL — ABNORMAL HIGH (ref 4.0–10.5)

## 2018-03-26 NOTE — Discharge Summary (Signed)
Physician Discharge Summary  Patient ID: Tammy Hart MRN: [id number] DOB/AGE: [birth year] 69 y.o.  Admit date: 03/25/2018 Discharge date: 03/26/2018   Procedures:  Procedure(s) (LRB): RIGHT TOTAL KNEE ARTHROPLASTY (Right)  Attending Physician:  Dr. Durene Romans   Admission Diagnoses:   Right knee primary OA / pain  Discharge Diagnoses:  Active Problems:   Obese   S/P total knee replacement  Past Medical History:  Diagnosis Date  . Allergy   . Arthritis    neck (mainly), other joints mildly  . Degenerative joint disease of left knee   . Pre-diabetes     HPI:    Tammy Hart, 67 y.o. female, has a history of pain and functional disability in the right knee due to arthritis and has failed non-surgical conservative treatments for greater than 12 weeks to include NSAID's and/or analgesics and activity modification.  Onset of symptoms was gradual, starting ~2 years ago with gradually worsening course since that time. The patient noted prior procedures on the knee to include  arthroplasty on the left knee in Sept 2017 per Dr. Charlann Boxer .  Patient currently rates pain in the right knee(s) at 8 out of 10 with activity. Patient has worsening of pain with activity and weight bearing, pain that interferes with activities of daily living, pain with passive range of motion, crepitus and joint swelling.  Patient has evidence of periarticular osteophytes and joint space narrowing by imaging studies.  There is no active infection.  Risks, benefits and expectations were discussed with the patient.  Risks including but not limited to the risk of anesthesia, blood clots, nerve damage, blood vessel damage, failure of the prosthesis, infection and up to and including death.  Patient understand the risks, benefits and expectations and wishes to proceed with surgery.   PCP: Anola Gurney, PA   Discharged Condition: good  Hospital Course:  Patient underwent the above stated procedure on 03/25/2018. Patient  tolerated the procedure well and brought to the recovery room in good condition and subsequently to the floor.  POD #1 BP: 122/71 ; Pulse: 87 ; Temp: 97.7 F (36.5 C) ; Resp: 17 Patient reports pain as  mild, pain controlled.  No events throughout the night.  Looking forward to progressing with PT.  Ready to be discharged home. Dorsiflexion/plantar flexion intact, incision: dressing C/D/I, no cellulitis present and compartment soft.   LABS  Basename    HGB     11.5  HCT     36.4    Discharge Exam: General appearance: alert, cooperative and no distress Extremities: Homans sign is negative, no sign of DVT, no edema, redness or tenderness in the calves or thighs and no ulcers, gangrene or trophic changes  Disposition: Home with follow up in 2 weeks   Follow-up Information    Durene Romans, MD. Schedule an appointment as soon as possible for a visit in 2 weeks.   Specialty:  Orthopedic Surgery Contact information: [address] [place] [address] [phone]           Discharge Instructions    Call MD / Call 911   Complete by:  As directed    If you experience chest pain or shortness of breath, CALL 911 and be transported to the hospital emergency room.  If you develope a fever above 101 F, pus (white drainage) or increased drainage or redness at the wound, or calf pain, call your surgeon's office.   Change dressing   Complete by:  As  directed    Maintain surgical dressing until follow up in the clinic. If the edges start to pull up, may reinforce with tape. If the dressing is no longer working, may remove and cover with gauze and tape, but must keep the area dry and clean.  Call with any questions or concerns.   Constipation Prevention   Complete by:  As directed    Drink plenty of fluids.  Prune juice may be helpful.  You may use a stool softener, such as Colace (over the counter) 100 mg twice a day.  Use MiraLax (over the counter) for constipation as  needed.   Diet - low sodium heart healthy   Complete by:  As directed    Discharge instructions   Complete by:  As directed    Maintain surgical dressing until follow up in the clinic. If the edges start to pull up, may reinforce with tape. If the dressing is no longer working, may remove and cover with gauze and tape, but must keep the area dry and clean.  Follow up in 2 weeks at [facility]. Call with any questions or concerns.   Increase activity slowly as tolerated   Complete by:  As directed    Weight bearing as tolerated with assist device (walker, cane, etc) as directed, use it as long as suggested by your surgeon or therapist, typically at least 4-6 weeks.   TED hose   Complete by:  As directed    Use stockings (TED hose) for 2 weeks on both leg(s).  You may remove them at night for sleeping.      Allergies as of 03/26/2018      Reactions   Cortisone Other (See Comments)   Causes elevated blood sugar and UTI   Nitrofurantoin Monohyd Macro Nausea And Vomiting   Sulfa Antibiotics Rash   Swelling, itching      Medication List    STOP taking these medications   acetaminophen 500 MG tablet Commonly known as:  TYLENOL   cephALEXin 500 MG capsule Commonly known as:  KEFLEX   meloxicam 15 MG tablet Commonly known as:  MOBIC   traMADol 50 MG tablet Commonly known as:  ULTRAM     TAKE these medications   aspirin 81 MG chewable tablet Commonly known as:  ASPIRIN CHILDRENS Chew 1 tablet (81 mg total) by mouth 2 (two) times daily. Take for 4 weeks, then resume regular dose.   diazepam 5 MG tablet Commonly known as:  VALIUM Take 5 mg by mouth at bedtime.   docusate sodium 100 MG capsule Commonly known as:  COLACE Take 1 capsule (100 mg total) by mouth 2 (two) times daily.   ferrous sulfate 325 (65 FE) MG tablet Commonly known as:  FERROUSUL Take 1 tablet (325 mg total) by mouth 3 (three) times daily with meals.   FLONASE ALLERGY RELIEF 50 MCG/ACT nasal  spray Generic drug:  fluticasone Place 1 spray into both nostrils at bedtime as needed for allergies or rhinitis.   HYDROcodone-acetaminophen 7.5-325 MG tablet Commonly known as:  NORCO Take 1-2 tablets by mouth every 4 (four) hours as needed for moderate pain.   methocarbamol 500 MG tablet Commonly known as:  ROBAXIN Take 1 tablet (500 mg total) by mouth every 6 (six) hours as needed for muscle spasms.   polyethylene glycol packet Commonly known as:  MIRALAX / GLYCOLAX Take 17 g by mouth 2 (two) times daily. What changed:    when to take this  reasons  to take this   WAL-ITIN D 24 HOUR 10-240 MG 24 hr tablet Generic drug:  loratadine-pseudoephedrine TAKE 1 TABLET BY MOUTH DAILY            Discharge Care Instructions  (From admission, onward)        Start     Ordered   03/26/18 0000  Change dressing    Comments:  Maintain surgical dressing until follow up in the clinic. If the edges start to pull up, may reinforce with tape. If the dressing is no longer working, may remove and cover with gauze and tape, but must keep the area dry and clean.  Call with any questions or concerns.   03/26/18 [id number]       Signed: Anastasio AuerbachMatthew S. Neco Kling   PA-C  03/26/2018, 1:38 PM

## 2018-03-26 NOTE — Evaluation (Signed)
Physical Therapy Evaluation Patient Details Name: Tammy Hart MRN: [id number] DOB: 12/03/1950 Today's Date: 03/26/2018   History of Present Illness  67 yo female s/p R TKA. Hx of L TKA 2017  Clinical Impression  On eval, pt was Min guard assist for mobility. She walked ~65 feet with a RW. Moderate pain with activity. Reviewed/practiced exercises, gait training, and stair training. Issued HEP for pt to perform 2x/day until she begins OP PT. All education completed. Okay to d/c from PT standpoint-made RN aware.     Follow Up Recommendations Follow surgeon's recommendation for DC plan and follow-up therapies    Equipment Recommendations  None recommended by PT    Recommendations for Other Services       Precautions / Restrictions Precautions Precautions: Fall;Knee Restrictions Weight Bearing Restrictions: No RLE Weight Bearing: Weight bearing as tolerated      Mobility  Bed Mobility Overal bed mobility: Needs Assistance Bed Mobility: Supine to Sit     Supine to sit: Supervision     General bed mobility comments: for safety. Increased time.   Transfers Overall transfer level: Needs assistance Equipment used: Rolling walker (2 wheeled) Transfers: Sit to/from Stand Sit to Stand: Min guard         General transfer comment: close guard for safety. VCs safety, technique, hand/LE placement.   Ambulation/Gait Ambulation/Gait assistance: Min guard Gait Distance (Feet): 65 Feet Assistive device: Rolling walker (2 wheeled) Gait Pattern/deviations: Step-to pattern     General Gait Details: close guard for safety. VCs safety, sequence, posture.   Stairs Stairs: Yes Stairs assistance: Min guard Stair Management: Sideways;Step to pattern;One rail Right Number of Stairs: 2 General stair comments: up and overt portable steps. VCs safety, sequence, technique. Educated pt on using 2 hands on 1 rail.   Wheelchair Mobility    Modified Rankin (Stroke Patients Only)        Balance                                             Pertinent Vitals/Pain Pain Assessment: 0-10 Pain Score: 7  Pain Location: R knee Pain Descriptors / Indicators: Aching;Sore Pain Intervention(s): Monitored during session;Repositioned;Ice applied    Home Living Family/patient expects to be discharged to:: Private residence Living Arrangements: Spouse/significant other Available Help at Discharge: Family Type of Home: House Home Access: Stairs to enter Entrance Stairs-Rails: Right Entrance Stairs-Number of Steps: 2-front; 2-garage Home Layout: One level Home Equipment: Environmental consultantWalker - 2 wheels;Walker - 4 wheels;Shower seat - built in;Hand held shower head;Grab bars - tub/shower      Prior Function Level of Independence: Independent               Hand Dominance        Extremity/Trunk Assessment   Upper Extremity Assessment Upper Extremity Assessment: Overall WFL for tasks assessed    Lower Extremity Assessment Lower Extremity Assessment: Generalized weakness(s/p R TKA)    Cervical / Trunk Assessment Cervical / Trunk Assessment: Normal  Communication   Communication: No difficulties  Cognition Arousal/Alertness: Awake/alert Behavior During Therapy: WFL for tasks assessed/performed Overall Cognitive Status: Within Functional Limits for tasks assessed                                        General Comments  Exercises Total Joint Exercises Ankle Circles/Pumps: AROM;Both;10 reps;Supine Quad Sets: AROM;Both;10 reps;Supine Hip ABduction/ADduction: AROM;Right;10 reps;Supine Straight Leg Raises: AROM;Right;10 reps;Supine Knee Flexion: AAROM;Right;10 reps;Seated Goniometric ROM: ~5-80 degrees   Assessment/Plan    PT Assessment Patient needs continued PT services  PT Problem List Decreased strength;Decreased range of motion;Decreased balance;Decreased mobility;Decreased activity tolerance;Decreased knowledge of use of  DME;Pain;Decreased knowledge of precautions       PT Treatment Interventions DME instruction;Gait training;Functional mobility training;Therapeutic activities;Balance training;Patient/family education;Therapeutic exercise;Stair training    PT Goals (Current goals can be found in the Care Plan section)  Acute Rehab PT Goals Patient Stated Goal: regain PLOF.  PT Goal Formulation: With patient Time For Goal Achievement: 04/09/18 Potential to Achieve Goals: Good    Frequency 7X/week   Barriers to discharge        Co-evaluation               AM-PAC PT "6 Clicks" Daily Activity  Outcome Measure Difficulty turning over in bed (including adjusting bedclothes, sheets and blankets)?: A Little Difficulty moving from lying on back to sitting on the side of the bed? : A Little Difficulty sitting down on and standing up from a chair with arms (e.g., wheelchair, bedside commode, etc,.)?: A Little Help needed moving to and from a bed to chair (including a wheelchair)?: A Little Help needed walking in hospital room?: A Little Help needed climbing 3-5 steps with a railing? : A Little 6 Click Score: 18    End of Session Equipment Utilized During Treatment: Gait belt Activity Tolerance: Patient tolerated treatment well Patient left: in chair;with call bell/phone within reach;with family/visitor present   PT Visit Diagnosis: Pain;Other abnormalities of gait and mobility (R26.89) Pain - Right/Left: Right Pain - part of body: Knee    Time: 9604-5409 PT Time Calculation (min) (ACUTE ONLY): 25 min   Charges:   PT Evaluation $PT Eval Low Complexity: 1 Low PT Treatments $Gait Training: 8-22 mins   PT G Codes:          Rebeca Alert, MPT Pager: [phone]

## 2018-03-26 NOTE — Progress Notes (Signed)
Pt and pt's Husband were provided with discharge instructions and prescriptions. After discussing D/C instructions and prescriptions, the pt and pt's Husband report no further questions or concerns.

## 2018-03-26 NOTE — Progress Notes (Signed)
     Subjective: 1 Day Post-Op Procedure(s) (LRB): RIGHT TOTAL KNEE ARTHROPLASTY (Right)   Patient reports pain as mild.   Patient's anticipated LOS is less than 2 midnights, meeting these requirements: - Lives within 1 hour of care - Has a competent adult at home to recover with post-op recover - NO history of  - Chronic pain requiring opiods  - Diabetes  - Coronary Artery Disease  - Heart failure  - Heart attack  - Stroke  - DVT/VTE  - Cardiac arrhythmia  - Respiratory Failure/COPD  - Renal failure  - Anemia  - Advanced Liver disease       Objective:   VITALS:   Vitals:   03/26/18 0119 03/26/18 0540  BP: (!) 144/77 122/71  Pulse: 91 87  Resp: 18 17  Temp: 97.9 F (36.6 C) 97.7 F (36.5 C)  SpO2: 96% 98%    Dorsiflexion/Plantar flexion intact Incision: dressing C/D/I No cellulitis present Compartment soft  LABS Recent Labs    03/26/18 0549  HGB 11.5*  HCT 36.4  WBC 14.3*  PLT 284    Recent Labs    03/26/18 0549  NA 140  K 4.7  BUN 16  CREATININE 0.75  GLUCOSE 163*     Assessment/Plan: 1 Day Post-Op Procedure(s) (LRB): RIGHT TOTAL KNEE ARTHROPLASTY (Right) Foley cath d/c'ed Advance diet Up with therapy D/C IV fluids Discharge home with home health [facility] Follow up with OLIN,Jerris Fleer D in 2 weeks.  Contact information:  [facility]( [facility]) [address] [place] [id number] [phone]    Obese (BMI 30-39.9) Estimated body mass index is 39.68 kg/m as calculated from the following:   Height as of this encounter: 5\' 3"  (1.6 m).   Weight as of this encounter: 101.6 kg (224 lb). Patient also counseled that weight may inhibit the healing process Patient counseled that losing weight will help with future health issues      Anastasio AuerbachMatthew S. Breana Litts   PAC  03/26/2018, 8:09 AM

## 2018-03-28 DIAGNOSIS — R262 Difficulty in walking, not elsewhere classified: Secondary | ICD-10-CM | POA: Diagnosis not present

## 2018-03-28 DIAGNOSIS — M25561 Pain in right knee: Secondary | ICD-10-CM | POA: Diagnosis not present

## 2018-03-31 DIAGNOSIS — R262 Difficulty in walking, not elsewhere classified: Secondary | ICD-10-CM | POA: Diagnosis not present

## 2018-03-31 DIAGNOSIS — M25561 Pain in right knee: Secondary | ICD-10-CM | POA: Diagnosis not present

## 2018-04-02 DIAGNOSIS — M25561 Pain in right knee: Secondary | ICD-10-CM | POA: Diagnosis not present

## 2018-04-02 DIAGNOSIS — R262 Difficulty in walking, not elsewhere classified: Secondary | ICD-10-CM | POA: Diagnosis not present

## 2018-04-08 DIAGNOSIS — R262 Difficulty in walking, not elsewhere classified: Secondary | ICD-10-CM | POA: Diagnosis not present

## 2018-04-08 DIAGNOSIS — M25561 Pain in right knee: Secondary | ICD-10-CM | POA: Diagnosis not present

## 2018-04-11 DIAGNOSIS — R262 Difficulty in walking, not elsewhere classified: Secondary | ICD-10-CM | POA: Diagnosis not present

## 2018-04-11 DIAGNOSIS — M25561 Pain in right knee: Secondary | ICD-10-CM | POA: Diagnosis not present

## 2018-04-14 ENCOUNTER — Other Ambulatory Visit: Payer: Self-pay | Admitting: Family Medicine

## 2018-04-15 DIAGNOSIS — R262 Difficulty in walking, not elsewhere classified: Secondary | ICD-10-CM | POA: Diagnosis not present

## 2018-04-15 DIAGNOSIS — M25561 Pain in right knee: Secondary | ICD-10-CM | POA: Diagnosis not present

## 2018-04-18 DIAGNOSIS — R262 Difficulty in walking, not elsewhere classified: Secondary | ICD-10-CM | POA: Diagnosis not present

## 2018-04-18 DIAGNOSIS — M25561 Pain in right knee: Secondary | ICD-10-CM | POA: Diagnosis not present

## 2018-04-22 DIAGNOSIS — M25561 Pain in right knee: Secondary | ICD-10-CM | POA: Diagnosis not present

## 2018-04-22 DIAGNOSIS — R262 Difficulty in walking, not elsewhere classified: Secondary | ICD-10-CM | POA: Diagnosis not present

## 2018-04-25 DIAGNOSIS — M25561 Pain in right knee: Secondary | ICD-10-CM | POA: Diagnosis not present

## 2018-04-25 DIAGNOSIS — R262 Difficulty in walking, not elsewhere classified: Secondary | ICD-10-CM | POA: Diagnosis not present

## 2018-04-29 DIAGNOSIS — M25561 Pain in right knee: Secondary | ICD-10-CM | POA: Diagnosis not present

## 2018-04-29 DIAGNOSIS — R262 Difficulty in walking, not elsewhere classified: Secondary | ICD-10-CM | POA: Diagnosis not present

## 2018-05-02 DIAGNOSIS — R262 Difficulty in walking, not elsewhere classified: Secondary | ICD-10-CM | POA: Diagnosis not present

## 2018-05-02 DIAGNOSIS — M25561 Pain in right knee: Secondary | ICD-10-CM | POA: Diagnosis not present

## 2018-05-06 DIAGNOSIS — R262 Difficulty in walking, not elsewhere classified: Secondary | ICD-10-CM | POA: Diagnosis not present

## 2018-05-06 DIAGNOSIS — M25561 Pain in right knee: Secondary | ICD-10-CM | POA: Diagnosis not present

## 2018-05-07 DIAGNOSIS — M1711 Unilateral primary osteoarthritis, right knee: Secondary | ICD-10-CM | POA: Diagnosis not present

## 2018-05-13 DIAGNOSIS — R262 Difficulty in walking, not elsewhere classified: Secondary | ICD-10-CM | POA: Diagnosis not present

## 2018-05-13 DIAGNOSIS — M25561 Pain in right knee: Secondary | ICD-10-CM | POA: Diagnosis not present

## 2018-05-19 ENCOUNTER — Other Ambulatory Visit: Payer: Self-pay | Admitting: Family Medicine

## 2018-05-19 MED ORDER — LORATADINE-PSEUDOEPHEDRINE ER 10-240 MG PO TB24: 1.0000 | ORAL_TABLET | Freq: Every day | ORAL | 1 refills | Status: DC

## 2018-05-20 DIAGNOSIS — M25561 Pain in right knee: Secondary | ICD-10-CM | POA: Diagnosis not present

## 2018-05-20 DIAGNOSIS — R262 Difficulty in walking, not elsewhere classified: Secondary | ICD-10-CM | POA: Diagnosis not present

## 2018-06-03 ENCOUNTER — Other Ambulatory Visit: Payer: Self-pay | Admitting: Family Medicine

## 2018-06-28 IMAGING — CR DG KNEE COMPLETE 4+V*R*
1 series · 4 of 4 positions shown · non-contrast
Comparison: 01/11/2011

CLINICAL DATA: Right knee pain and inability to flex.

EXAM:
RIGHT KNEE - COMPLETE 4+ VIEW

[Series 1: dg knee complete 4 views right · 0.14mm/px · 4 of 4 slices shown]
[im 1/4]
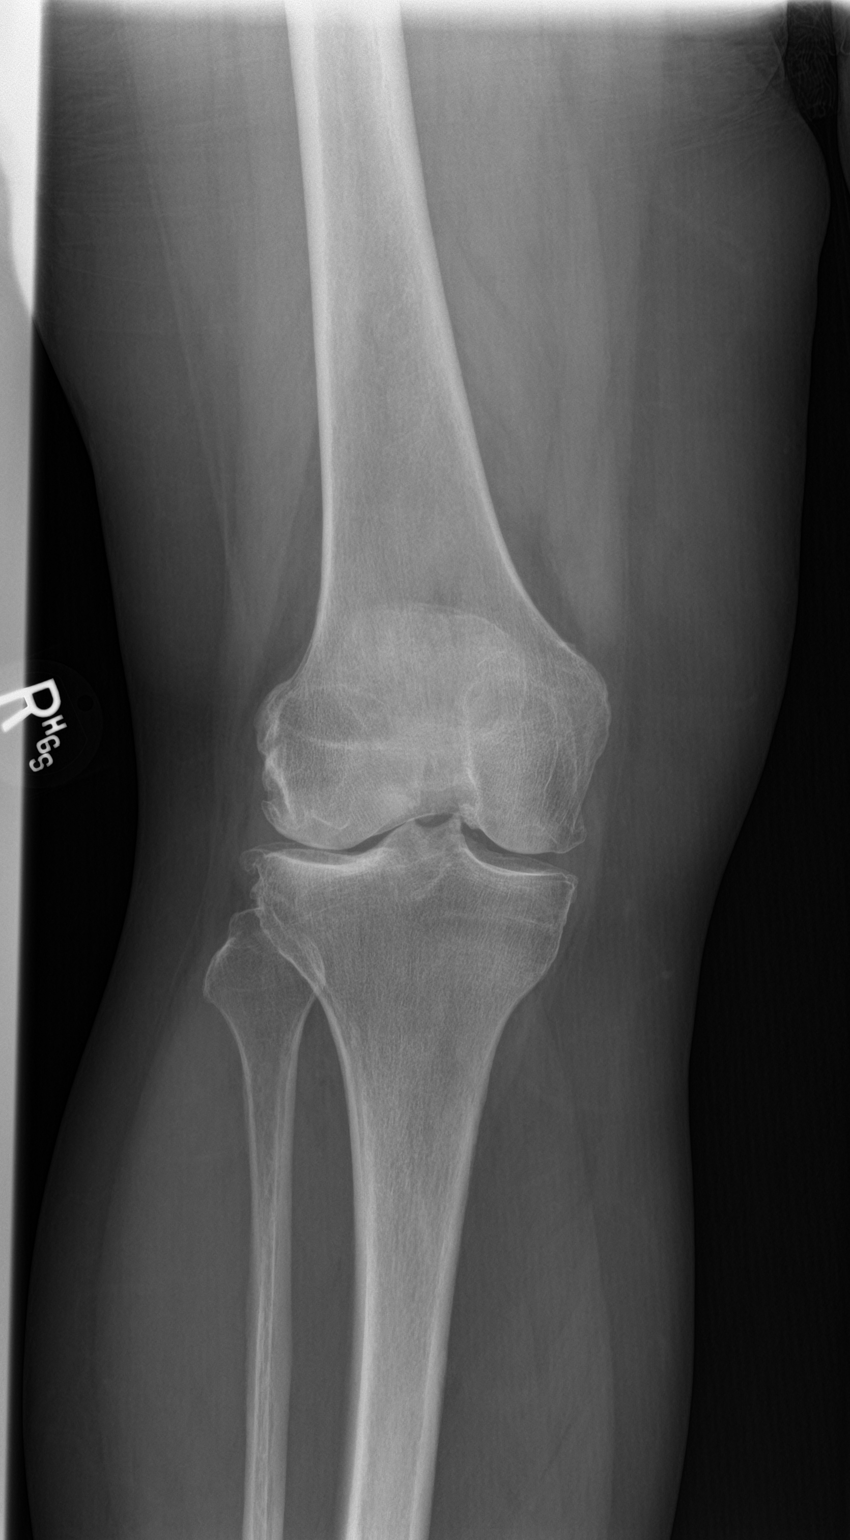
[im 2/4]
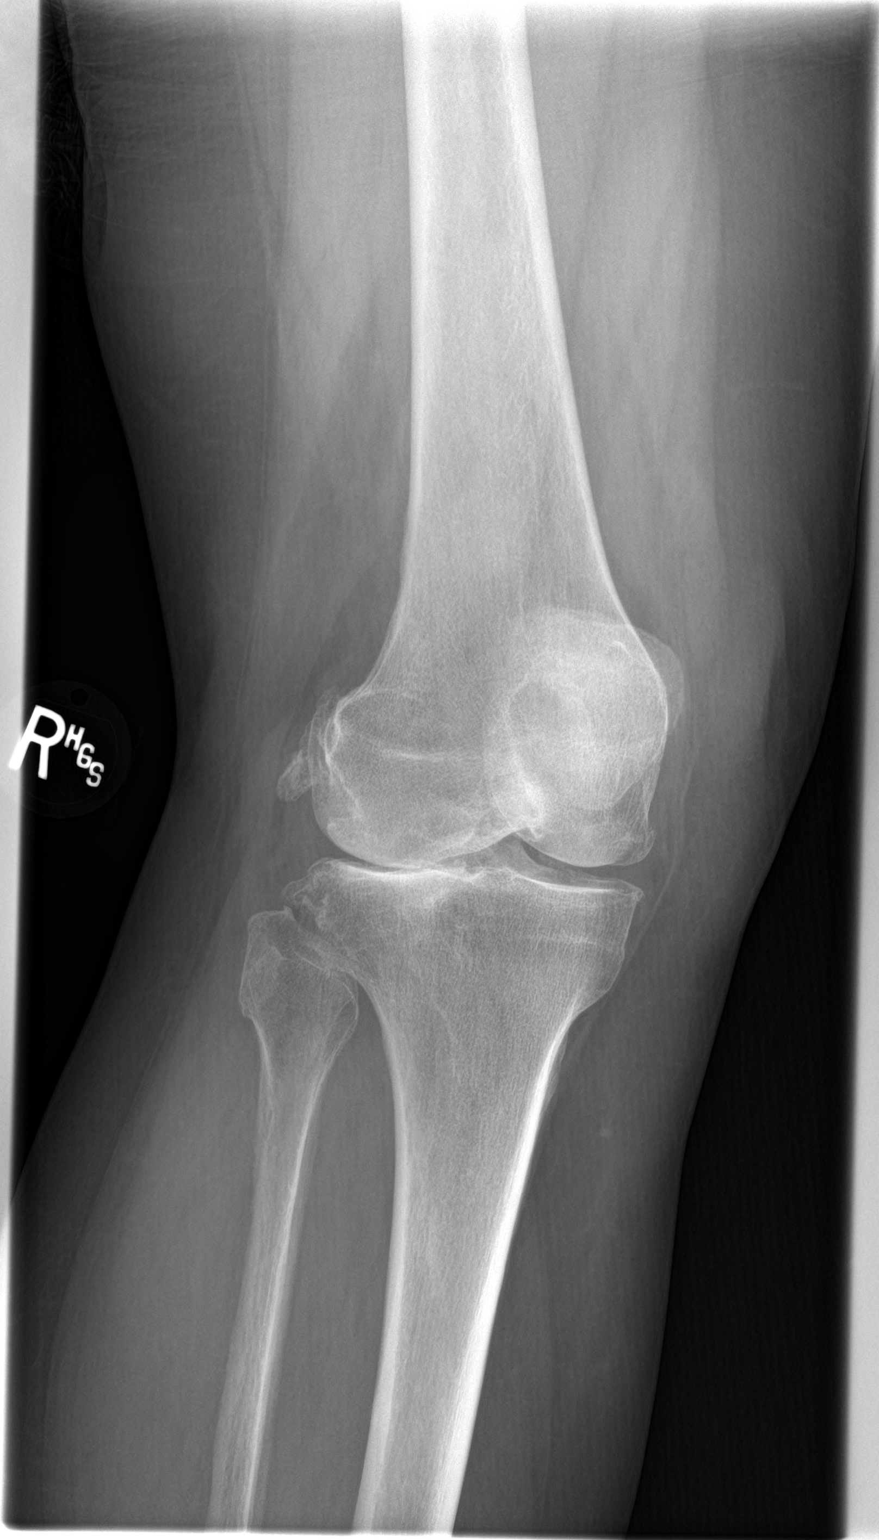
[im 3/4]
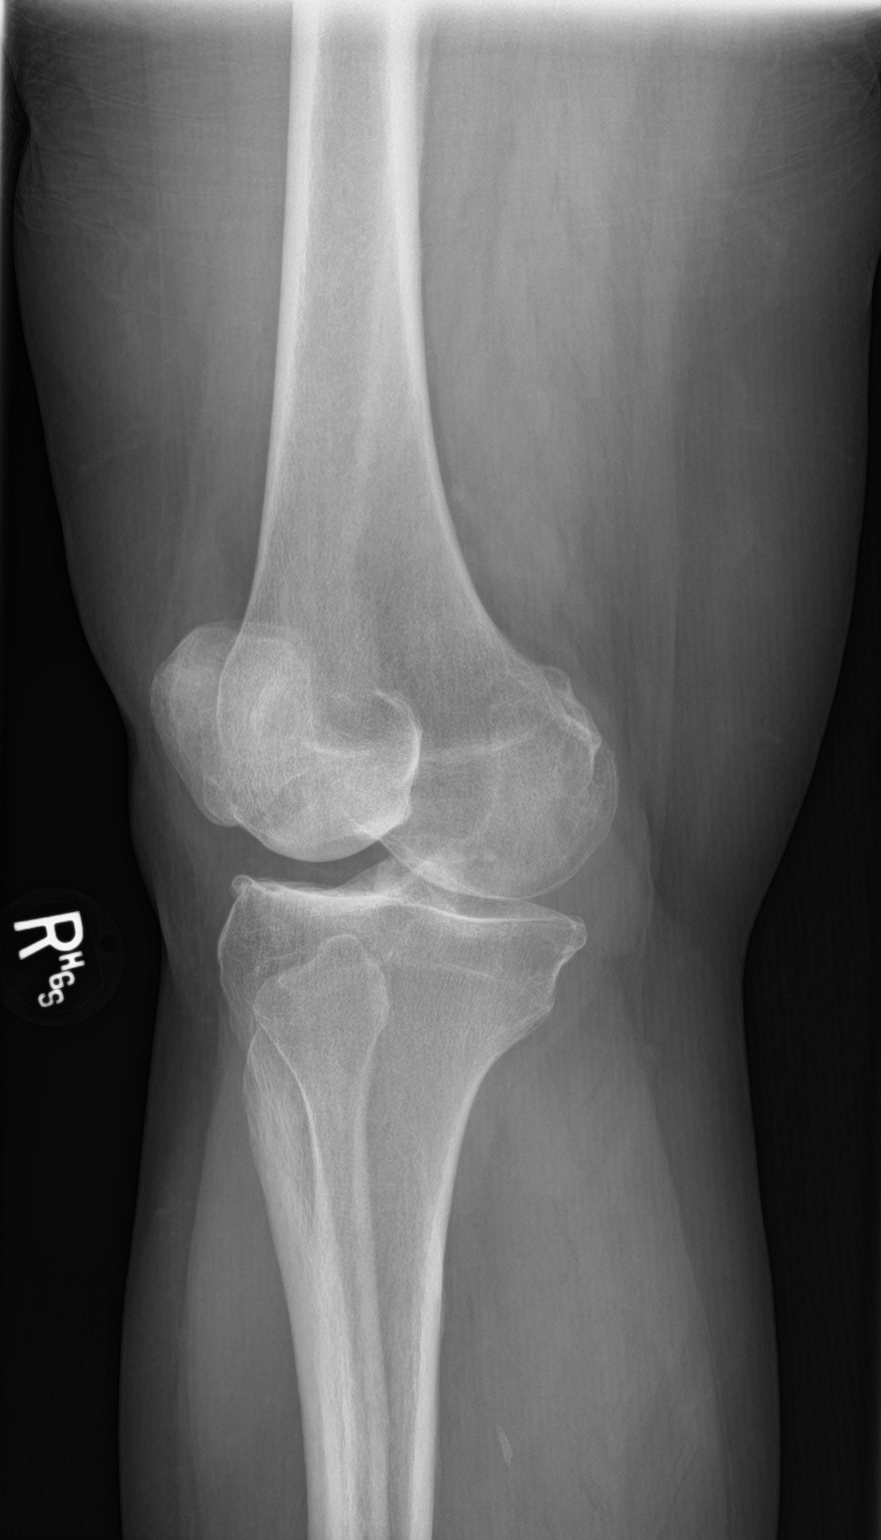
[im 4/4]
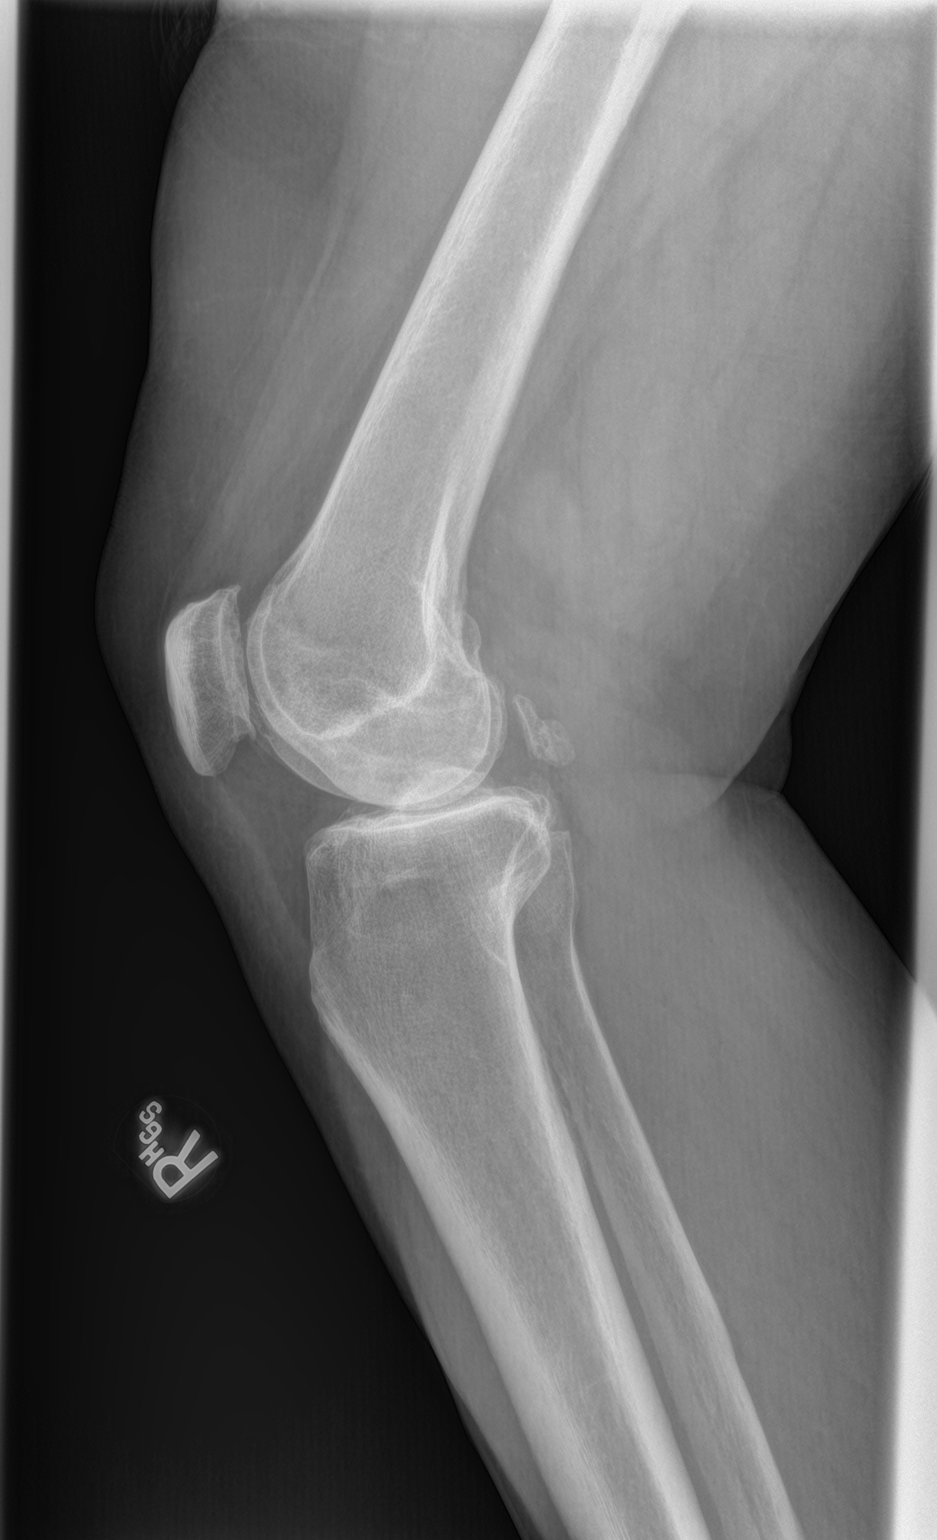

[4 of 4 positions shown; findings below may reference images not displayed]

FINDINGS: No evidence of fracture, or dislocation. There is a small
suprapatellar joint effusion. There are moderate to severe 3
compartment osteoarthritic changes of the right knee joint with disc
space narrowing, subchondral sclerosis and osteophyte formation.
Scattered subchondral cysts also seen.
IMPRESSION: Moderate to severe 3 compartment osteoarthritic changes of the right
knee.

Minimal right suprapatellar joint effusion.

## 2018-07-01 ENCOUNTER — Other Ambulatory Visit: Payer: Self-pay | Admitting: Family Medicine

## 2018-07-11 ENCOUNTER — Other Ambulatory Visit: Payer: Self-pay | Admitting: Family Medicine

## 2018-07-28 ENCOUNTER — Other Ambulatory Visit: Payer: Self-pay | Admitting: Family Medicine

## 2018-09-25 ENCOUNTER — Other Ambulatory Visit: Payer: Self-pay | Admitting: Family Medicine

## 2018-10-12 ENCOUNTER — Other Ambulatory Visit: Payer: Self-pay | Admitting: Family Medicine

## 2018-11-11 ENCOUNTER — Ambulatory Visit (INDEPENDENT_AMBULATORY_CARE_PROVIDER_SITE_OTHER): Payer: Medicare Other | Admitting: Family Medicine

## 2018-11-11 ENCOUNTER — Encounter: Payer: Self-pay | Admitting: Family Medicine

## 2018-11-11 VITALS — BP 128/82 | HR 86 | Temp 97.7°F | Resp 16 | Wt 234.2 lb

## 2018-11-11 DIAGNOSIS — R0981 Nasal congestion: Secondary | ICD-10-CM

## 2018-11-11 MED ORDER — PREDNISONE 10 MG PO TABS: ORAL_TABLET | ORAL | 0 refills | Status: DC

## 2018-11-11 NOTE — Progress Notes (Signed)
  Subjective:     Patient ID: Tammy Hart, female   DOB: 07/05/1951, 68 y.o.   MRN: [id number] Chief Complaint  Patient presents with  . Nasal Congestion    Pt comes in with reports of congestion and tenderness in face x 4 weeks. Pt reports she has tried several OTC medications with no relief.   HPI States it "feels like a ball" in her right maxillary area. Reports minimal clear drainage. No tooth sensitivity. Has tried steroid spray, Afrin, Claritin D, ad  Vick's with little improvement. Reports hx of left maxillary sinus surgery in the past.  Review of Systems     Objective:   Physical Exam Constitutional:      General: She is not in acute distress.    Appearance: She is not ill-appearing.  HENT:     Head:     Comments: No sinus tenderness on palpation    Right Ear: Tympanic membrane and ear canal normal.     Left Ear: Tympanic membrane and ear canal normal.     Mouth/Throat:     Comments: No PND, or tonsillar enlargement. Neurological:     Mental Status: She is alert.        Assessment:    1. Sinus congestion - Ambulatory referral to ENT - predniSONE (DELTASONE) 10 MG tablet; Taper daily as follows: 6 pills, 5, 4, 3, 2, 1  Dispense: 21 tablet; Refill: 0    Plan:    Further f/u pending ENT referral.

## 2018-11-11 NOTE — Patient Instructions (Signed)
We will call you about the ENT referral. 

## 2018-11-24 DIAGNOSIS — E119 Type 2 diabetes mellitus without complications: Secondary | ICD-10-CM | POA: Diagnosis not present

## 2018-11-24 LAB — HM DIABETES EYE EXAM

## 2018-11-25 ENCOUNTER — Other Ambulatory Visit: Payer: Self-pay | Admitting: Family Medicine

## 2018-12-02 ENCOUNTER — Telehealth: Payer: Self-pay | Admitting: Family Medicine

## 2018-12-02 NOTE — Telephone Encounter (Signed)
Patient got a tick off of her on Sunday.  The bite spot has a lump under skin and is as big around as a quarter.    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  She is not having any symptoms.

## 2018-12-03 NOTE — Telephone Encounter (Signed)
LMTCB

## 2018-12-03 NOTE — Telephone Encounter (Signed)
That sounds like a normal local reaction to a tick bite.  Does not need to be seen unless she develops other symptoms or target-like rash around bite.  Keep the area clean.

## 2018-12-04 NOTE — Telephone Encounter (Signed)
Patient advised and verbally voiced understanding.  

## 2018-12-31 DIAGNOSIS — R7303 Prediabetes: Secondary | ICD-10-CM | POA: Diagnosis not present

## 2018-12-31 DIAGNOSIS — N390 Urinary tract infection, site not specified: Secondary | ICD-10-CM | POA: Diagnosis not present

## 2018-12-31 DIAGNOSIS — R030 Elevated blood-pressure reading, without diagnosis of hypertension: Secondary | ICD-10-CM | POA: Diagnosis not present

## 2018-12-31 DIAGNOSIS — G8929 Other chronic pain: Secondary | ICD-10-CM | POA: Diagnosis not present

## 2019-01-08 ENCOUNTER — Other Ambulatory Visit: Payer: Self-pay | Admitting: Family Medicine

## 2019-01-08 DIAGNOSIS — M170 Bilateral primary osteoarthritis of knee: Secondary | ICD-10-CM

## 2019-01-08 MED ORDER — MELOXICAM 15 MG PO TABS: ORAL_TABLET | ORAL | 0 refills | Status: DC

## 2019-01-08 NOTE — Telephone Encounter (Signed)
[facility] faxed refill request for the following medications:  meloxicam (MOBIC) 15 MG tablet   Please advise.  

## 2019-02-16 DIAGNOSIS — B9689 Other specified bacterial agents as the cause of diseases classified elsewhere: Secondary | ICD-10-CM | POA: Diagnosis not present

## 2019-02-16 DIAGNOSIS — I10 Essential (primary) hypertension: Secondary | ICD-10-CM | POA: Diagnosis not present

## 2019-02-16 DIAGNOSIS — J019 Acute sinusitis, unspecified: Secondary | ICD-10-CM | POA: Diagnosis not present

## 2019-04-07 ENCOUNTER — Other Ambulatory Visit: Payer: Self-pay | Admitting: Physician Assistant

## 2019-04-07 DIAGNOSIS — M170 Bilateral primary osteoarthritis of knee: Secondary | ICD-10-CM

## 2019-04-07 NOTE — Telephone Encounter (Signed)
LOV 11/11/18 with [place]

## 2019-04-29 ENCOUNTER — Other Ambulatory Visit: Payer: Self-pay

## 2019-05-08 DIAGNOSIS — B9689 Other specified bacterial agents as the cause of diseases classified elsewhere: Secondary | ICD-10-CM | POA: Diagnosis not present

## 2019-05-08 DIAGNOSIS — I10 Essential (primary) hypertension: Secondary | ICD-10-CM | POA: Diagnosis not present

## 2019-05-08 DIAGNOSIS — J019 Acute sinusitis, unspecified: Secondary | ICD-10-CM | POA: Diagnosis not present

## 2019-07-09 DIAGNOSIS — R252 Cramp and spasm: Secondary | ICD-10-CM | POA: Diagnosis not present

## 2019-07-09 DIAGNOSIS — Z87891 Personal history of nicotine dependence: Secondary | ICD-10-CM | POA: Diagnosis not present

## 2019-07-09 DIAGNOSIS — N390 Urinary tract infection, site not specified: Secondary | ICD-10-CM | POA: Diagnosis not present

## 2019-07-09 DIAGNOSIS — R300 Dysuria: Secondary | ICD-10-CM | POA: Diagnosis not present

## 2019-07-09 DIAGNOSIS — Z1211 Encounter for screening for malignant neoplasm of colon: Secondary | ICD-10-CM | POA: Diagnosis not present

## 2020-08-24 ENCOUNTER — Other Ambulatory Visit: Payer: Self-pay | Admitting: Physician Assistant

## 2020-08-24 DIAGNOSIS — M170 Bilateral primary osteoarthritis of knee: Secondary | ICD-10-CM

## 2020-08-24 NOTE — Telephone Encounter (Signed)
Requested medication (s) are due for refill today: Yes  Requested medication (s) are on the active medication list: Yes  Last refill:  04/07/19  Future visit scheduled: No  Notes to clinic:  Prescription has expired and pt. Needs a visit.    Requested Prescriptions  Pending Prescriptions Disp Refills   meloxicam (MOBIC) 15 MG tablet [Pharmacy Med Name: MELOXICAM 15MG  TABLETS] 90 tablet 0    Sig: TAKE 1 TABLET(15 MG) BY MOUTH DAILY WITH FOOD AS NEEDED FOR KNEE PAIN      Analgesics:  COX2 Inhibitors Failed - 08/24/2020 11:35 AM      Failed - HGB in normal range and within 360 days    Hemoglobin  Date Value Ref Range Status  03/26/2018 11.5 (L) 12.0 - 15.0 g/dL Final  03/28/2018 40/81/4481 11.1 - 15.9 g/dL Final          Failed - Cr in normal range and within 360 days    Creatinine, Ser  Date Value Ref Range Status  03/26/2018 0.75 0.44 - 1.00 mg/dL Final          Failed - Valid encounter within last 12 months    Recent Outpatient Visits           1 year ago Sinus congestion   [facility] [place], JESSHEIM   2 years ago Hyperglycemia   [facility] [place], JESSHEIM   2 years ago Low back strain, initial encounter   [facility] [place], [place], [place]   2 years ago Right leg swelling   [facility], [place]   2 years ago Acute cystitis without hematuria   [facility] [place], [place]ESSHEIM              [place] - Patient is not pregnant

## 2021-01-11 ENCOUNTER — Encounter: Payer: Self-pay | Admitting: Ophthalmology

## 2021-01-11 ENCOUNTER — Other Ambulatory Visit: Payer: Self-pay

## 2021-01-16 NOTE — Discharge Instructions (Signed)

## 2021-01-18 ENCOUNTER — Ambulatory Visit: Payer: Medicare Other | Admitting: Anesthesiology

## 2021-01-18 ENCOUNTER — Encounter
Admission: RE | Disposition: A | Discharge status: Home / Self Care | Payer: Self-pay | Source: Home / Self Care | Attending: Ophthalmology

## 2021-01-18 ENCOUNTER — Encounter: Payer: Self-pay | Admitting: Ophthalmology

## 2021-01-18 ENCOUNTER — Ambulatory Visit
Admission: RE | Admit: 2021-01-18 | Discharge: 2021-01-18 | Disposition: A | Discharge status: Home / Self Care | Payer: Medicare Other | Attending: Ophthalmology | Admitting: Ophthalmology

## 2021-01-18 ENCOUNTER — Other Ambulatory Visit: Payer: Self-pay

## 2021-01-18 DIAGNOSIS — Z882 Allergy status to sulfonamides status: Secondary | ICD-10-CM | POA: Insufficient documentation

## 2021-01-18 DIAGNOSIS — Z888 Allergy status to other drugs, medicaments and biological substances status: Secondary | ICD-10-CM | POA: Insufficient documentation

## 2021-01-18 DIAGNOSIS — Z8249 Family history of ischemic heart disease and other diseases of the circulatory system: Secondary | ICD-10-CM | POA: Diagnosis not present

## 2021-01-18 DIAGNOSIS — I10 Essential (primary) hypertension: Secondary | ICD-10-CM | POA: Diagnosis not present

## 2021-01-18 DIAGNOSIS — Z7982 Long term (current) use of aspirin: Secondary | ICD-10-CM | POA: Insufficient documentation

## 2021-01-18 DIAGNOSIS — Z96653 Presence of artificial knee joint, bilateral: Secondary | ICD-10-CM | POA: Diagnosis not present

## 2021-01-18 DIAGNOSIS — R7303 Prediabetes: Secondary | ICD-10-CM | POA: Insufficient documentation

## 2021-01-18 DIAGNOSIS — Z87891 Personal history of nicotine dependence: Secondary | ICD-10-CM | POA: Insufficient documentation

## 2021-01-18 DIAGNOSIS — Z90710 Acquired absence of both cervix and uterus: Secondary | ICD-10-CM | POA: Insufficient documentation

## 2021-01-18 DIAGNOSIS — H2512 Age-related nuclear cataract, left eye: Secondary | ICD-10-CM | POA: Diagnosis not present

## 2021-01-18 DIAGNOSIS — Z79899 Other long term (current) drug therapy: Secondary | ICD-10-CM | POA: Insufficient documentation

## 2021-01-18 DIAGNOSIS — Z7984 Long term (current) use of oral hypoglycemic drugs: Secondary | ICD-10-CM | POA: Diagnosis not present

## 2021-01-18 HISTORY — PX: CATARACT EXTRACTION W/PHACO: SHX586

## 2021-01-18 HISTORY — DX: Essential (primary) hypertension: I10

## 2021-01-18 LAB — GLUCOSE, CAPILLARY
Glucose-Capillary: 138 mg/dL — ABNORMAL HIGH (ref 70–99)
Glucose-Capillary: 150 mg/dL — ABNORMAL HIGH (ref 70–99)

## 2021-01-18 SURGERY — PHACOEMULSIFICATION, CATARACT, WITH IOL INSERTION
Anesthesia: Monitor Anesthesia Care | Site: Eye | Laterality: Left

## 2021-01-18 MED ORDER — BRIMONIDINE TARTRATE-TIMOLOL 0.2-0.5 % OP SOLN
OPHTHALMIC | Status: DC | PRN
  Administered 2021-01-18: 1 [drp] via OPHTHALMIC

## 2021-01-18 MED ORDER — NA HYALUR & NA CHOND-NA HYALUR 0.4-0.35 ML IO KIT
PACK | INTRAOCULAR | Status: DC | PRN
  Administered 2021-01-18: 1 mL via INTRAOCULAR

## 2021-01-18 MED ORDER — LIDOCAINE HCL (PF) 2 % IJ SOLN
INTRAOCULAR | Status: DC | PRN
  Administered 2021-01-18: 1 mL

## 2021-01-18 MED ORDER — DEXMEDETOMIDINE HCL 200 MCG/2ML IV SOLN
INTRAVENOUS | Status: DC | PRN
  Administered 2021-01-18 (×2): 10 ug via INTRAVENOUS

## 2021-01-18 MED ORDER — FENTANYL CITRATE (PF) 100 MCG/2ML IJ SOLN
INTRAMUSCULAR | Status: DC | PRN
  Administered 2021-01-18: 100 ug via INTRAVENOUS

## 2021-01-18 MED ORDER — EPINEPHRINE PF 1 MG/ML IJ SOLN
INTRAOCULAR | Status: DC | PRN
  Administered 2021-01-18: 52 mL via OPHTHALMIC

## 2021-01-18 MED ORDER — ACETAMINOPHEN 160 MG/5ML PO SOLN: 325.0000 mg | Freq: Once | ORAL | Status: DC

## 2021-01-18 MED ORDER — ARMC OPHTHALMIC DILATING DROPS
1.0000 "application " | OPHTHALMIC | Status: DC | PRN
  Administered 2021-01-18 (×3): 1 via OPHTHALMIC

## 2021-01-18 MED ORDER — ACETAMINOPHEN 325 MG PO TABS: 325.0000 mg | ORAL_TABLET | Freq: Once | ORAL | Status: DC

## 2021-01-18 MED ORDER — MOXIFLOXACIN HCL 0.5 % OP SOLN
OPHTHALMIC | Status: DC | PRN
  Administered 2021-01-18: 0.2 mL via OPHTHALMIC

## 2021-01-18 MED ORDER — MIDAZOLAM HCL 2 MG/2ML IJ SOLN
INTRAMUSCULAR | Status: DC | PRN
  Administered 2021-01-18: 2 mg via INTRAVENOUS

## 2021-01-18 MED ORDER — TETRACAINE HCL 0.5 % OP SOLN
1.0000 [drp] | OPHTHALMIC | Status: DC | PRN
  Administered 2021-01-18 (×2): 1 [drp] via OPHTHALMIC

## 2021-01-18 SURGICAL SUPPLY — 25 items
CANNULA ANT/CHMB 27GA (MISCELLANEOUS) ×2 IMPLANT
GLOVE SURG ENC TEXT LTX SZ7.5 (GLOVE) ×4 IMPLANT
GLOVE SURG TRIUMPH 8.0 PF LTX (GLOVE) ×2 IMPLANT
GOWN STRL REUS W/ TWL LRG LVL3 (GOWN DISPOSABLE) ×2 IMPLANT
GOWN STRL REUS W/TWL LRG LVL3 (GOWN DISPOSABLE) ×4
LENS IOL DIOP 19.0 (Intraocular Lens) ×2 IMPLANT
LENS IOL TECNIS MONO 19.0 (Intraocular Lens) ×1 IMPLANT
MARKER SKIN DUAL TIP RULER LAB (MISCELLANEOUS) ×2 IMPLANT
NDL RETROBULBAR .5 NSTRL (NEEDLE) IMPLANT
NEEDLE CAPSULORHEX 25GA (NEEDLE) ×2 IMPLANT
NEEDLE FILTER BLUNT 18X 1/2SAF (NEEDLE) ×2
NEEDLE FILTER BLUNT 18X1 1/2 (NEEDLE) ×2 IMPLANT
PACK CATARACT BRASINGTON (MISCELLANEOUS) ×2 IMPLANT
PACK EYE AFTER SURG (MISCELLANEOUS) ×2 IMPLANT
PACK OPTHALMIC (MISCELLANEOUS) ×2 IMPLANT
RING MALYGIN 7.0 (MISCELLANEOUS) IMPLANT
SOLUTION OPHTHALMIC SALT (MISCELLANEOUS) ×2 IMPLANT
SUT ETHILON 10-0 CS-B-6CS-B-6 (SUTURE)
SUT VICRYL  9 0 (SUTURE)
SUT VICRYL 9 0 (SUTURE) IMPLANT
SUTURE EHLN 10-0 CS-B-6CS-B-6 (SUTURE) IMPLANT
SYR 3ML LL SCALE MARK (SYRINGE) ×4 IMPLANT
SYR TB 1ML LUER SLIP (SYRINGE) ×2 IMPLANT
WATER STERILE IRR 250ML POUR (IV SOLUTION) ×2 IMPLANT
WIPE NON LINTING 3.25X3.25 (MISCELLANEOUS) ×2 IMPLANT

## 2021-01-18 NOTE — Op Note (Signed)
  OPERATIVE NOTE  Tammy Hart [id number] 01/18/2021   PREOPERATIVE DIAGNOSIS:  Nuclear sclerotic cataract left eye. H25.12   POSTOPERATIVE DIAGNOSIS:    Nuclear sclerotic cataract left eye.     PROCEDURE:  Phacoemusification with posterior chamber intraocular lens placement of the left eye  Ultrasound time: Procedure(s) with comments: CATARACT EXTRACTION PHACO AND INTRAOCULAR LENS PLACEMENT (IOC) LEFT DIABETIC (Left) - 4.17 0:56.0 7.5%  LENS:   Implant Name Type Inv. Item Serial No. Manufacturer Lot No. LRB No. Used Action  LENS IOL DIOP 19.0 - [id number] Intraocular Lens LENS IOL DIOP 19.0 [id number] [id number]   Left 1 Implanted      SURGEON:  Deirdre Evener, MD   ANESTHESIA:  Topical with tetracaine drops and 2% Xylocaine jelly, augmented with 1% preservative-free intracameral lidocaine.    COMPLICATIONS:  None.   DESCRIPTION OF PROCEDURE:  The patient was identified in the holding room and transported to the operating room and placed in the supine position under the operating microscope.  The left eye was identified as the operative eye and it was prepped and draped in the usual sterile ophthalmic fashion.   A 1 millimeter clear-corneal paracentesis was made at the 1:30 position.  0.5 ml of preservative-free 1% lidocaine was injected into the anterior chamber.  The anterior chamber was filled with Viscoat viscoelastic.  A 2.4 millimeter keratome was used to make a near-clear corneal incision at the 10:30 position.  .  A curvilinear capsulorrhexis was made with a cystotome and capsulorrhexis forceps.  Balanced salt solution was used to hydrodissect and hydrodelineate the nucleus.   Phacoemulsification was then used in stop and chop fashion to remove the lens nucleus and epinucleus.  The remaining cortex was then removed using the irrigation and aspiration handpiece. Provisc was then placed into the capsular bag to distend it for lens placement.  A lens was then injected  into the capsular bag.  The remaining viscoelastic was aspirated.   Wounds were hydrated with balanced salt solution.  The anterior chamber was inflated to a physiologic pressure with balanced salt solution.  No wound leaks were noted. Vigamox 0.2 ml of a 1mg  per ml solution was injected into the anterior chamber for a dose of 0.2 mg of intracameral antibiotic at the completion of the case.   Timolol and Brimonidine drops were applied to the eye.  The patient was taken to the recovery room in stable condition without complications of anesthesia or surgery.  Tammy Hart 01/18/2021, 8:07 AM

## 2021-01-18 NOTE — Anesthesia Preprocedure Evaluation (Signed)
Anesthesia Evaluation  Patient identified by MRN, date of birth, ID band Patient awake    Reviewed: Allergy & Precautions, H&P , NPO status , Patient's Chart, lab work & pertinent test results  Airway Mallampati: II  TM Distance: >3 FB Neck ROM: full    Dental no notable dental hx.    Pulmonary former smoker,    Pulmonary exam normal breath sounds clear to auscultation       Cardiovascular hypertension, Normal cardiovascular exam Rhythm:regular Rate:Normal     Neuro/Psych    GI/Hepatic   Endo/Other  diabetesMorbid obesity  Renal/GU      Musculoskeletal   Abdominal   Peds  Hematology   Anesthesia Other Findings   Reproductive/Obstetrics                             Anesthesia Physical Anesthesia Plan  ASA: II  Anesthesia Plan: MAC   Post-op Pain Management:    Induction:   PONV Risk Score and Plan: 2 and Treatment may vary due to age or medical condition, TIVA and Midazolam  Airway Management Planned:   Additional Equipment:   Intra-op Plan:   Post-operative Plan:   Informed Consent: I have reviewed the patients History and Physical, chart, labs and discussed the procedure including the risks, benefits and alternatives for the proposed anesthesia with the patient or authorized representative who has indicated his/her understanding and acceptance.     Dental Advisory Given  Plan Discussed with: CRNA  Anesthesia Plan Comments:         Anesthesia Quick Evaluation

## 2021-01-18 NOTE — H&P (Signed)
[facility]   Primary Care Physician:  Ofilia Neas, PA-C Ophthalmologist: Dr. Lockie Mola  Pre-Procedure History & Physical: HPI:  Tammy Hart is a 70 y.o. female here for ophthalmic surgery.   Past Medical History:  Diagnosis Date  . Allergy   . Arthritis    neck (mainly), other joints mildly  . Degenerative joint disease of left knee   . Hypertension   . Pre-diabetes     Past Surgical History:  Procedure Laterality Date  . ABDOMINAL HYSTERECTOMY    . CATARACT EXTRACTION W/PHACO Right 05/11/2015   Procedure: CATARACT EXTRACTION PHACO AND INTRAOCULAR LENS PLACEMENT (IOC);  Surgeon: Lockie Mola, MD;  Location: [facility];  Service: Ophthalmology;  Laterality: Right;  PER DR OFFICE(ELLEN) PLEASE KEEP PT ARRIVAL TIME 9-9:30  . RHINOPLASTY    . TOTAL KNEE ARTHROPLASTY Left 06/12/2016   Procedure: LEFT TOTAL KNEE ARTHROPLASTY;  Surgeon: Durene Romans, MD;  Location: [facility];  Service: Orthopedics;  Laterality: Left;  . TOTAL KNEE ARTHROPLASTY Right 03/25/2018   Procedure: RIGHT TOTAL KNEE ARTHROPLASTY;  Surgeon: Durene Romans, MD;  Location: [facility];  Service: Orthopedics;  Laterality: Right;  70 mins    Prior to Admission medications   Medication Sig Start Date End Date Taking? Authorizing Provider  acetaminophen (TYLENOL) 650 MG CR tablet Take 1,300 mg by mouth every 8 (eight) hours as needed for pain.   Yes [provider]  amLODipine (NORVASC) 5 MG tablet Take 5 mg by mouth daily.   Yes [provider]  ASPIRIN 81 PO Take by mouth daily.   Yes [provider]  cephALEXin (KEFLEX) 250 MG capsule Take by mouth daily.   Yes [provider]  CLARITIN-D 24 HOUR 10-240 MG 24 hr tablet TAKE 1 TABLET BY MOUTH DAILY 11/25/18  Yes Anola Gurney, PA  fluticasone (FLONASE) 50 MCG/ACT nasal spray Place 1 spray into both nostrils at bedtime as needed for allergies or rhinitis.   Yes [provider]  gabapentin  (NEURONTIN) 300 MG capsule Take 300 mg by mouth daily.   Yes [provider]  LORazepam (ATIVAN) 0.5 MG tablet Take 0.5 mg by mouth every 8 (eight) hours as needed for anxiety.   Yes [provider]  meloxicam (MOBIC) 15 MG tablet TAKE 1 TABLET(15 MG) BY MOUTH DAILY WITH FOOD AS NEEDED FOR KNEE PAIN 04/07/19  Yes Joycelyn Man M, PA-C  metFORMIN (GLUCOPHAGE) 500 MG tablet Take by mouth 2 (two) times daily with a meal.   Yes [provider]  polyethylene glycol (MIRALAX / GLYCOLAX) packet Take 17 g by mouth 2 (two) times daily. 03/25/18  Yes Babish, Molli Hazard, PA-C  rosuvastatin (CRESTOR) 5 MG tablet Take 5 mg by mouth daily.   Yes [provider]    Allergies as of 11/22/2020 - Review Complete 11/11/2018  Allergen Reaction Noted  . Cortisone Other (See Comments) 05/31/2016  . Nitrofurantoin monohyd macro Nausea And Vomiting 06/27/2015  . Sulfa antibiotics Rash 05/05/2015    Family History  Problem Relation Age of Onset  . Stroke Mother   . Congestive Heart Failure Father   . Hypertension Son   . Stroke Maternal Grandfather   . Congestive Heart Failure Paternal Grandmother   . Congestive Heart Failure Paternal Grandfather     Social History   Socioeconomic History  . Marital status: Married    Spouse name: Not on file  . Number of children: Not on file  . Years of education: Not on file  .  Highest education level: Not on file  Occupational History  . Not on file  Tobacco Use  . Smoking status: Former Smoker    Quit date: 10/01/2006    Years since quitting: 14.3  . Smokeless tobacco: Never Used  Vaping Use  . Vaping Use: Never used  Substance and Sexual Activity  . Alcohol use: Yes    Comment: 2 times a year  . Drug use: No  . Sexual activity: Not on file  Other Topics Concern  . Not on file  Social History Narrative  . Not on file   Social Determinants of Health   Financial Resource Strain: Not on file  Food Insecurity: Not on  file  Transportation Needs: Not on file  Physical Activity: Not on file  Stress: Not on file  Social Connections: Not on file  Intimate Partner Violence: Not on file    Review of Systems: See HPI, otherwise negative ROS  Physical Exam: BP 135/62   Pulse 70   Temp 97.8 F (36.6 C) (Temporal)   Resp 20   Ht 5\' 4"  (1.626 m)   Wt 96.9 kg   SpO2 98%   BMI 36.66 kg/m  General:   Alert,  pleasant and cooperative in NAD Head:  Normocephalic and atraumatic. Lungs:  Clear to auscultation.    Heart:  Regular rate and rhythm.   Impression/Plan: Tammy Hart is here for ophthalmic surgery.  Risks, benefits, limitations, and alternatives regarding ophthalmic surgery have been reviewed with the patient.  Questions have been answered.  All parties agreeable.   Cyril Mourning, MD  01/18/2021, 7:36 AM

## 2021-01-18 NOTE — Transfer of Care (Signed)
Immediate Anesthesia Transfer of Care Note  Patient: Tammy Hart  Procedure(s) Performed: CATARACT EXTRACTION PHACO AND INTRAOCULAR LENS PLACEMENT (IOC) LEFT DIABETIC (Left Eye)  Patient Location: PACU  Anesthesia Type: MAC  Level of Consciousness: awake, alert  and patient cooperative  Airway and Oxygen Therapy: Patient Spontanous Breathing and Patient connected to supplemental oxygen  Post-op Assessment: Post-op Vital signs reviewed, Patient's Cardiovascular Status Stable, Respiratory Function Stable, Patent Airway and No signs of Nausea or vomiting  Post-op Vital Signs: Reviewed and stable  Complications: No complications documented.

## 2021-01-18 NOTE — Anesthesia Postprocedure Evaluation (Signed)
Anesthesia Post Note  Patient: Tammy Hart  Procedure(s) Performed: CATARACT EXTRACTION PHACO AND INTRAOCULAR LENS PLACEMENT (IOC) LEFT DIABETIC (Left Eye)     Patient location during evaluation: PACU Anesthesia Type: MAC Level of consciousness: awake and alert and oriented Pain management: satisfactory to patient Vital Signs Assessment: post-procedure vital signs reviewed and stable Respiratory status: spontaneous breathing, nonlabored ventilation and respiratory function stable Cardiovascular status: blood pressure returned to baseline and stable Postop Assessment: Adequate PO intake and No signs of nausea or vomiting Anesthetic complications: no   No complications documented.  Raliegh Ip

## 2021-01-18 NOTE — Anesthesia Procedure Notes (Signed)
Procedure Name: MAC Date/Time: 01/18/2021 7:54 AM Performed by: Silvana Newness, CRNA Pre-anesthesia Checklist: Patient identified, Emergency Drugs available, Suction available, Patient being monitored and Timeout performed Patient Re-evaluated:Patient Re-evaluated prior to induction Oxygen Delivery Method: Nasal cannula Placement Confirmation: positive ETCO2

## 2021-01-19 ENCOUNTER — Encounter: Payer: Self-pay | Admitting: Ophthalmology
